# Patient Record
Sex: Male | Born: 1955 | Race: White | Hispanic: No | Marital: Married | Smoking: Never smoker
Health system: Southern US, Community
[De-identification: ages and names within clinical notes are randomized; demographics above are authoritative.]

## PROBLEM LIST (undated history)

## (undated) DIAGNOSIS — C801 Malignant (primary) neoplasm, unspecified: Secondary | ICD-10-CM

---

## 2006-12-13 DIAGNOSIS — G4730 Sleep apnea, unspecified: Secondary | ICD-10-CM

## 2006-12-13 HISTORY — DX: Sleep apnea, unspecified: G47.30

## 2017-01-19 HISTORY — PX: OTHER SURGICAL HISTORY: SHX169

## 2018-11-15 DIAGNOSIS — C61 Malignant neoplasm of prostate: Secondary | ICD-10-CM | POA: Diagnosis present

## 2019-03-18 ENCOUNTER — Encounter: Payer: Self-pay | Admitting: Emergency Medicine

## 2019-03-18 ENCOUNTER — Other Ambulatory Visit: Payer: Self-pay

## 2019-03-18 ENCOUNTER — Emergency Department: Payer: 59

## 2019-03-18 ENCOUNTER — Observation Stay
Admission: EM | Admit: 2019-03-18 | Discharge: 2019-03-19 | Disposition: A | Discharge status: Home / Self Care | Payer: 59 | Attending: Internal Medicine | Admitting: Internal Medicine

## 2019-03-18 ENCOUNTER — Observation Stay: Payer: 59

## 2019-03-18 DIAGNOSIS — G459 Transient cerebral ischemic attack, unspecified: Principal | ICD-10-CM | POA: Insufficient documentation

## 2019-03-18 DIAGNOSIS — Z20822 Contact with and (suspected) exposure to covid-19: Secondary | ICD-10-CM | POA: Diagnosis not present

## 2019-03-18 DIAGNOSIS — C61 Malignant neoplasm of prostate: Secondary | ICD-10-CM | POA: Diagnosis not present

## 2019-03-18 DIAGNOSIS — G4730 Sleep apnea, unspecified: Secondary | ICD-10-CM | POA: Diagnosis not present

## 2019-03-18 DIAGNOSIS — E785 Hyperlipidemia, unspecified: Secondary | ICD-10-CM | POA: Diagnosis not present

## 2019-03-18 DIAGNOSIS — N529 Male erectile dysfunction, unspecified: Secondary | ICD-10-CM | POA: Diagnosis not present

## 2019-03-18 DIAGNOSIS — I10 Essential (primary) hypertension: Secondary | ICD-10-CM | POA: Diagnosis present

## 2019-03-18 DIAGNOSIS — I081 Rheumatic disorders of both mitral and tricuspid valves: Secondary | ICD-10-CM | POA: Insufficient documentation

## 2019-03-18 DIAGNOSIS — R9431 Abnormal electrocardiogram [ECG] [EKG]: Secondary | ICD-10-CM | POA: Diagnosis not present

## 2019-03-18 DIAGNOSIS — R4701 Aphasia: Secondary | ICD-10-CM | POA: Insufficient documentation

## 2019-03-18 HISTORY — DX: Malignant (primary) neoplasm, unspecified: C80.1

## 2019-03-18 LAB — COMPREHENSIVE METABOLIC PANEL
ALT: 29 U/L (ref 0–44)
AST: 23 U/L (ref 15–41)
Albumin: 4.4 g/dL (ref 3.5–5.0)
Alkaline Phosphatase: 71 U/L (ref 38–126)
Anion gap: 9 (ref 5–15)
BUN: 23 mg/dL (ref 8–23)
CO2: 24 mmol/L (ref 22–32)
Calcium: 9.5 mg/dL (ref 8.9–10.3)
Chloride: 103 mmol/L (ref 98–111)
Creatinine, Ser: 1.18 mg/dL (ref 0.61–1.24)
GFR calc Af Amer: >60 mL/min (ref >60)
GFR calc non Af Amer: >60 mL/min (ref >60)
Glucose, Bld: 101 mg/dL — ABNORMAL HIGH (ref 70–99)
Potassium: 4 mmol/L (ref 3.5–5.1)
Sodium: 136 mmol/L (ref 135–145)
Total Bilirubin: 0.8 mg/dL (ref 0.3–1.2)
Total Protein: 7.3 g/dL (ref 6.5–8.1)

## 2019-03-18 LAB — DIFFERENTIAL
Abs Immature Granulocytes: 0.02 10*3/uL (ref 0.00–0.07)
Basophils Absolute: 0.1 10*3/uL (ref 0.0–0.1)
Basophils Relative: 1 %
Eosinophils Absolute: 0.4 10*3/uL (ref 0.0–0.5)
Eosinophils Relative: 5 %
Immature Granulocytes: 0 %
Lymphocytes Relative: 32 %
Lymphs Abs: 2.3 10*3/uL (ref 0.7–4.0)
Monocytes Absolute: 0.6 10*3/uL (ref 0.1–1.0)
Monocytes Relative: 8 %
Neutro Abs: 3.8 10*3/uL (ref 1.7–7.7)
Neutrophils Relative %: 54 %

## 2019-03-18 LAB — URINE DRUG SCREEN, QUALITATIVE (ARMC ONLY)
Amphetamines, Ur Screen: NOT DETECTED
Barbiturates, Ur Screen: NOT DETECTED
Benzodiazepine, Ur Scrn: NOT DETECTED
Cannabinoid 50 Ng, Ur ~~LOC~~: NOT DETECTED
Cocaine Metabolite,Ur ~~LOC~~: NOT DETECTED
MDMA (Ecstasy)Ur Screen: NOT DETECTED
Methadone Scn, Ur: NOT DETECTED
Opiate, Ur Screen: NOT DETECTED
Phencyclidine (PCP) Ur S: NOT DETECTED
Tricyclic, Ur Screen: NOT DETECTED

## 2019-03-18 LAB — CBC
HCT: 42.6 % (ref 39.0–52.0)
Hemoglobin: 14.4 g/dL (ref 13.0–17.0)
MCH: 30.5 pg (ref 26.0–34.0)
MCHC: 33.8 g/dL (ref 30.0–36.0)
MCV: 90.3 fL (ref 80.0–100.0)
Platelets: 181 10*3/uL (ref 150–400)
RBC: 4.72 MIL/uL (ref 4.22–5.81)
RDW: 12.2 % (ref 11.5–15.5)
WBC: 7.1 10*3/uL (ref 4.0–10.5)
nRBC: 0 % (ref 0.0–0.2)

## 2019-03-18 LAB — TROPONIN I (HIGH SENSITIVITY)
Troponin I (High Sensitivity): 5 ng/L (ref <18)
Troponin I (High Sensitivity): 7 ng/L (ref <18)
Troponin I (High Sensitivity): 9 ng/L (ref <18)

## 2019-03-18 LAB — URINALYSIS, ROUTINE W REFLEX MICROSCOPIC
Bilirubin Urine: NEGATIVE
Glucose, UA: NEGATIVE mg/dL
Hgb urine dipstick: NEGATIVE
Ketones, ur: NEGATIVE mg/dL
Leukocytes,Ua: NEGATIVE
Nitrite: NEGATIVE
Protein, ur: NEGATIVE mg/dL
Specific Gravity, Urine: 1.02 (ref 1.005–1.030)
pH: 6 (ref 5.0–8.0)

## 2019-03-18 LAB — GLUCOSE, CAPILLARY: Glucose-Capillary: 111 mg/dL — ABNORMAL HIGH (ref 70–99)

## 2019-03-18 LAB — PROTIME-INR
INR: 0.9 (ref 0.8–1.2)
Prothrombin Time: 12.4 seconds (ref 11.4–15.2)

## 2019-03-18 LAB — SEDIMENTATION RATE: Sed Rate: 9 mm/hr (ref 0–20)

## 2019-03-18 LAB — APTT: aPTT: 35 seconds (ref 24–36)

## 2019-03-18 LAB — FIBRIN DERIVATIVES D-DIMER (ARMC ONLY): Fibrin derivatives D-dimer (ARMC): 504.07 ng/mL (FEU) — ABNORMAL HIGH (ref 0.00–499.00)

## 2019-03-18 LAB — ETHANOL: Alcohol, Ethyl (B): <10 mg/dL (ref <10)

## 2019-03-18 MED ORDER — ASPIRIN 81 MG PO CHEW
324.0000 mg | CHEWABLE_TABLET | Freq: Once | ORAL | Status: AC
  Administered 2019-03-18: 324 mg via ORAL
  Filled 2019-03-18: qty 4

## 2019-03-18 MED ORDER — HEPARIN SODIUM (PORCINE) 5000 UNIT/ML IJ SOLN
5000.0000 [IU] | Freq: Three times a day (TID) | INTRAMUSCULAR | Status: DC
  Administered 2019-03-18 – 2019-03-19 (×2): 5000 [IU] via SUBCUTANEOUS
  Filled 2019-03-18 (×2): qty 1

## 2019-03-18 MED ORDER — ACETAMINOPHEN 160 MG/5ML PO SOLN
650.0000 mg | ORAL | Status: DC | PRN
  Filled 2019-03-18: qty 20.3

## 2019-03-18 MED ORDER — STROKE: EARLY STAGES OF RECOVERY BOOK: Freq: Once | Status: AC

## 2019-03-18 MED ORDER — ASPIRIN 325 MG PO TABS
325.0000 mg | ORAL_TABLET | Freq: Every day | ORAL | Status: DC
  Filled 2019-03-18: qty 1

## 2019-03-18 MED ORDER — ASPIRIN 300 MG RE SUPP
300.0000 mg | Freq: Every day | RECTAL | Status: DC
  Filled 2019-03-18: qty 1

## 2019-03-18 MED ORDER — IOHEXOL 350 MG/ML SOLN
75.0000 mL | Freq: Once | INTRAVENOUS | Status: AC | PRN
  Administered 2019-03-18: 75 mL via INTRAVENOUS

## 2019-03-18 MED ORDER — ASPIRIN 300 MG RE SUPP: 300.0000 mg | Freq: Every day | RECTAL | Status: DC

## 2019-03-18 MED ORDER — IOHEXOL 350 MG/ML SOLN
100.0000 mL | Freq: Once | INTRAVENOUS | Status: AC | PRN
  Administered 2019-03-18: 100 mL via INTRAVENOUS

## 2019-03-18 MED ORDER — ASPIRIN 325 MG PO TABS
325.0000 mg | ORAL_TABLET | Freq: Once | ORAL | Status: DC
  Filled 2019-03-18: qty 1

## 2019-03-18 MED ORDER — ASPIRIN 325 MG PO TABS
325.0000 mg | ORAL_TABLET | Freq: Every day | ORAL | Status: DC
  Administered 2019-03-19: 325 mg via ORAL
  Filled 2019-03-18: qty 1

## 2019-03-18 MED ORDER — ATORVASTATIN CALCIUM 20 MG PO TABS: 40.0000 mg | ORAL_TABLET | Freq: Every day | ORAL | Status: DC

## 2019-03-18 MED ORDER — ACETAMINOPHEN 325 MG PO TABS: 650.0000 mg | ORAL_TABLET | ORAL | Status: DC | PRN

## 2019-03-18 MED ORDER — ACETAMINOPHEN 650 MG RE SUPP: 650.0000 mg | RECTAL | Status: DC | PRN

## 2019-03-18 MED ORDER — SODIUM CHLORIDE 0.9 % IV SOLN: INTRAVENOUS | Status: DC

## 2019-03-18 NOTE — Assessment & Plan Note (Signed)
Pt follows with Dr. Redmond Pulling.

## 2019-03-18 NOTE — ED Notes (Signed)
PT symptoms have resolved, teleneuro states pt is not tPa candidate at this time.  Will continue to monitor closely.

## 2019-03-18 NOTE — Consult Note (Signed)
[facility] Consult Services   Date of Service:   03/18/2019 15:42:08  Impression:     .  G45.9 - Transient cerebral ischemic attack, unspecified  Comments/Sign-Out: Patient is a 64 years old man who presents to the ED after having a transient episode of expressive aphasia, His symptoms improved within 15-20 minutes. He had no other associated symptoms. NIHSS is 1 for a very subtle right facial droop. Non contrast CTH showed no acute abnormalities. Presentation is concerning for TIA. Recommend to give aspirin 325 mg po now if no contraindications and to admit for further work-up.  Metrics: Last Known Well: 03/18/2019 15:00:00 [facility] Notification Time: 03/18/2019 15:42:08 Arrival Time: 03/18/2019 15:16:00 Stamp Time: 03/18/2019 15:42:08 Time First Login Attempt: 03/18/2019 15:47:01 Symptoms: Word-finding difficulty NIHSS Start Assessment Time: 03/18/2019 16:00:28 Patient is not a candidate for Alteplase/Activase. Alteplase Medical Decision: 03/18/2019 16:05:00 Patient was not deemed candidate for Alteplase/Activase thrombolytics because of Resolved symptoms (no residual disabling symptoms).  CT head showed no acute hemorrhage or acute core infarct.  Lower Likelihood of Large Vessel Occlusion but Following Stat Studies are Recommended  CTA Head and Neck. CT Perfusion. Reviewed, No Indication of Large Vessel Occlusive Thrombus, Patient is not an NIR Candidate.   ED Physician notified of diagnostic impression and management plan on 03/18/2019 16:13:07  Our recommendations are outlined below.  Recommendations:     .  Activate Stroke Protocol Admission/Order Set     .  Stroke/Telemetry Floor     .  Neuro Checks     .  Bedside Swallow Eval     .  DVT Prophylaxis     .  IV Fluids, Normal Saline     .  Head of Bed 30 Degrees     .  Euglycemia and Avoid Hyperthermia (PRN Acetaminophen)     .  Initiate Aspirin 325 MG Daily  Routine Consultation with  [facility] for Follow up Care  Sign Out:     .  Discussed with Emergency Department Provider    ------------------------------------------------------------------------------  History of Present Illness: Patient is a 64 year old Male.  Patient was brought by EMS for symptoms of Word-finding difficulty  Patient is a 64 years old man with history of HTN who presents to the ED after having sudden onset difficulty speaking while playing with his grandkids. LKW at 15:00. By the time patient arrived to the ED symptoms have mostly resolved. He did still had a mild right facial droop.  Last seen normal was within 4.5 hours. There is no history of hemorrhagic complications or intracranial hemorrhage. There is no history of Recent Anticoagulants. There is no history of recent major surgery. There is no history of recent stroke.  Past Medical History:     . Hyperlipidemia  Anticoagulant use:  No  Antiplatelet use: No    Examination: BP(152/67), Pulse(57), Blood Glucose(111) 1A: Level of Consciousness - Alert; keenly responsive + 0 1B: Ask Month and Age - Both Questions Right + 0 1C: Blink Eyes & Squeeze Hands - Performs Both Tasks + 0 2: Test Horizontal Extraocular Movements - Normal + 0 3: Test Visual Fields - No Visual Loss + 0 4: Test Facial Palsy (Use Grimace if Obtunded) - Minor paralysis (flat nasolabial fold, smile asymmetry) + 1 5A: Test Left Arm Motor Drift - No Drift for 10 Seconds + 0 5B: Test Right Arm Motor Drift - No Drift for 10 Seconds + 0 6A: Test Left Leg Motor Drift - No Drift for 5 Seconds +  0 6B: Test Right Leg Motor Drift - No Drift for 5 Seconds + 0 7: Test Limb Ataxia (FNF/Heel-Shin) - No Ataxia + 0 8: Test Sensation - Normal; No sensory loss + 0 9: Test Language/Aphasia - Normal; No aphasia + 0 10: Test Dysarthria - Normal + 0 11: Test Extinction/Inattention - No abnormality + 0  NIHSS Score: 1  Pre-Morbid Modified Ranking Scale: 0 Points = No  symptoms at all   Patient/Family was informed the Neurology Consult would occur via TeleHealth consult by way of interactive audio and video telecommunications and consented to receiving care in this manner.   Due to the immediate potential for life-threatening deterioration due to underlying acute neurologic illness, I spent 35 minutes providing critical care. This time includes time for face to face visit via telemedicine, review of medical records, imaging studies and discussion of findings with providers, the patient and/or family.   Dr Harriet Masson   [facility] [phone]  Case [id number]

## 2019-03-18 NOTE — ED Provider Notes (Signed)
[name] Note   CSN: [id number] Arrival date & time: 03/18/19  1516  An emergency department physician performed an initial assessment on this suspected stroke patient at 1525.  History Chief Complaint  Patient presents with  . Code Stroke    Michael Aguilar is a 64 y.o. male otherwise healthy here presenting with trouble speaking.  Patient states that he went to [employer] with his grandkids at 10 AM this morning. Around 3 PM, he sat down and was try to talk to his wife and noticed that he cannot get words out.  He states that he has some slurred speech and just could not speak normally .  He stated that it lasted about 20 minutes and his speech is still a little abnormal.  Denies any weakness or numbness.  The history is provided by the patient.       No past medical history on file.  There are no problems to display for this patient.     No family history on file.  Social History   Tobacco Use  . Smoking status: Never Smoker  . Smokeless tobacco: Never Used  Substance Use Topics  . Alcohol use: Not on file  . Drug use: Not on file    Home Medications Prior to Admission medications   Not on File    Allergies    Penicillins  Review of Systems   Review of Systems  Neurological: Positive for speech difficulty.  All other systems reviewed and are negative.   Physical Exam Updated Vital Signs BP (!) 152/67   Pulse (!) 57   Temp 98.3 F (36.8 C) (Oral)   Resp 18   Ht 6\' 4"  (1.93 m)   Wt 114.3 kg   SpO2 96%   BMI 30.67 kg/m   Physical Exam Vitals and nursing note reviewed.  Constitutional:      Comments: Some expressive aphasia   HENT:     Head: Normocephalic.     Nose: Nose normal.     Mouth/Throat:     Mouth: Mucous membranes are moist.  Eyes:     Extraocular Movements: Extraocular movements intact.     Pupils: Pupils are equal, round, and reactive to light.  Cardiovascular:     Rate  and Rhythm: Normal rate and regular rhythm.     Pulses: Normal pulses.     Heart sounds: Normal heart sounds.  Pulmonary:     Effort: Pulmonary effort is normal.     Breath sounds: Normal breath sounds.  Abdominal:     General: Abdomen is flat.     Palpations: Abdomen is soft.  Musculoskeletal:        General: Normal Bazen of motion.     Cervical back: Normal Zahner of motion.  Skin:    General: Skin is warm.  Neurological:     Mental Status: He is alert.     Comments: ? L facial droop. ? Some expressive aphasia. CN otherwise unremarkable. Nl strength and sensation bilateral arms and legs. Nl finger to nose bilaterally   Psychiatric:        Mood and Affect: Mood normal.     ED Results / Procedures / Treatments   Labs (all labs ordered are listed, but only abnormal results are displayed) Labs Reviewed  GLUCOSE, CAPILLARY - Abnormal; Notable for the following components:      Result Value   Glucose-Capillary 111 (*)    All other components within normal limits  ETHANOL  PROTIME-INR  APTT  CBC  DIFFERENTIAL  COMPREHENSIVE METABOLIC PANEL  URINE DRUG SCREEN, QUALITATIVE ([place] ONLY)  URINALYSIS, ROUTINE W REFLEX MICROSCOPIC    EKG None  Radiology No results found.  Procedures Procedures (including critical care time)  Medications Ordered in ED Medications - No data to display  ED Course  I have reviewed the triage vital signs and the nursing notes.  Pertinent labs & imaging results that were available during my care of the patient were reviewed by me and considered in my medical decision making (see chart for details).    MDM Rules/Calculators/A&P                      Michael Aguilar is a 64 y.o. male who presented with expressive aphasia.  Patient had trouble speaking onset around 3 PM. His symptoms are improving but still has some expressive aphasia.  Code stroke LVO activated.  His NIH score is 1 and does not mollify for IV TPA right now.  He may be a candidate  for IR TPA.  Will get a stat CT angio head neck and CT perfusion study.  Teleneurology consulted.  6:16 PM Labs and MRI negative. Neuro recommend admission for TIA workup. His facial droop resolved. I thought he had L facial droop but teleneuro thought R facial droop but that has resolved. Expressive aphasia also resolved   Final Clinical Impression(s) / ED Diagnoses Final diagnoses:  None    Rx / DC Orders ED Discharge Orders    None       Drenda Freeze, MD 03/18/19 1819

## 2019-03-18 NOTE — Assessment & Plan Note (Signed)
We will continue on home setting so cpap .

## 2019-03-18 NOTE — ED Notes (Signed)
To CT with RN.

## 2019-03-18 NOTE — ED Triage Notes (Signed)
Pt to triage states he was playing with grand children and was trying to talk with wife and was unable to get his words out.  PT states symptoms lasted approximately 20 minutes.  PT able to answer all questions, states he still feels like not everything is coming out clearly.  Dr. Darl Householder at bedside.

## 2019-03-18 NOTE — Assessment & Plan Note (Addendum)
Pt was recently diagnosed with pcp during October of last might and was trying to manage it with diet. Pt is on noom diet.  Ordinarily he says he eats well balanced diet but today Pt did have french fries from [place] today.  Pt advised to limit sodium.

## 2019-03-18 NOTE — ED Notes (Signed)
transported to MRI

## 2019-03-18 NOTE — H&P (Signed)
History and Physical    Michael Aguilar [id number] DOB: Jun 08, 1955 DOA: 03/18/2019   PCP: Michael Arenas, MD   Patient coming from: Home.  Chief Complaint: TIA.  HPI: Michael Aguilar is a 64 y.o. male with medical history significant of dyslipidemia , second hand tobacco abuse since childhood as both parents were smokers. Patient states that he went to [employer] with his grandkids at 10 AM this morning. Around 3 PM, he sat down and was try to talk to his wife and noticed that he cannot get words out.  He states that he has some slurred speech and just could not speak normally .  He stated that it lasted about 20 minutes and his speech is still a little abnormal.  Denies any weakness or numbness. Pt also reports vision fog, where he only saw minimal and has vision loss that lasted about 10 minutes.  Pt reports aphasia not dysarthria words were incomprehensible.   ED Course:  Blood pressure 123/72, pulse 63, temperature 98.8 F (37.1 C), temperature source Oral, resp. rate 20, height 6\' 4"  (1.93 m), weight 114.3 kg, SpO2 99 %. cmp/cbc wd/wnl./ Head ct negative .  Review of Systems: As per HPI otherwise 10 point review of systems negative.   Past Medical History:  Diagnosis Date  . Sleep apnea 12/13/2006   Sleep Apnea.  On CPAP since 2005.    Past Surgical History:  Procedure Laterality Date  . left shoulder surgery Left 2019     reports that he has never smoked. He has never used smokeless tobacco. He reports current alcohol use. He reports that he does not use drugs.  Allergies  Allergen Reactions  . Penicillins Rash    Other reaction(s): Other (See Comments)    Family History  Problem Relation Age of Onset  . Bladder Cancer Father     Physical Exam: Vitals:   03/18/19 1830 03/18/19 1900 03/18/19 1949 03/18/19 2200  BP: 136/81 (!) 153/83 (!) 143/84 123/72  Pulse: 61 (!) 51 80 63  Resp: 16 17 20 20   Temp:   98 F (36.7 C) 98.8 F (37.1 C)  TempSrc:   Oral Oral   SpO2: 98% 97% 99%   Weight:      Height:         Constitutional: NAD, calm, comfortable Vitals:   03/18/19 1830 03/18/19 1900 03/18/19 1949 03/18/19 2200  BP: 136/81 (!) 153/83 (!) 143/84 123/72  Pulse: 61 (!) 51 80 63  Resp: 16 17 20 20   Temp:   98 F (36.7 C) 98.8 F (37.1 C)  TempSrc:   Oral Oral  SpO2: 98% 97% 99%   Weight:      Height:       Eyes: PERRL, lids and conjunctivae normal ENMT: Mucous membranes are moist. Posterior pharynx clear of any exudate or lesions.Normal dentition.  Neck: normal, supple, no masses, no thyromegaly Respiratory: clear to auscultation bilaterally, no wheezing, no crackles. Normal respiratory effort. No accessory muscle use.  Cardiovascular: Regular rate and rhythm, no murmurs / rubs / gallops. No extremity edema. 2+ pedal pulses. No carotid bruits.  Abdomen: no tenderness, no masses palpated. No hepatosplenomegaly. Bowel sounds positive.  Musculoskeletal: no clubbing / cyanosis. No joint deformity upper and lower extremities. Good ROM, no contractures. Normal muscle tone.  Skin: no rashes, lesions, ulcers. No induration Neurologic: CN 2-12 grossly intact.  DTR normal. Strength 5/5 in all 4.  Psychiatric: Normal judgment and insight. Alert and oriented x 3. Normal mood.  Labs on Admission: I have personally reviewed following labs and imaging studies  CBC: Recent Labs  Lab 03/18/19 1536  WBC 7.1  NEUTROABS 3.8  HGB 14.4  HCT 42.6  MCV 90.3  PLT 0000000   Basic Metabolic Panel: Recent Labs  Lab 03/18/19 1536  NA 136  K 4.0  CL 103  CO2 24  GLUCOSE 101*  BUN 23  CREATININE 1.18  CALCIUM 9.5   GFR: Estimated Creatinine Clearance: 86.3 mL/min (by C-G formula based on SCr of 1.18 mg/dL). Liver Function Tests: Recent Labs  Lab 03/18/19 1536  AST 23  ALT 29  ALKPHOS 71  BILITOT 0.8  PROT 7.3  ALBUMIN 4.4   No results for input(s): LIPASE, AMYLASE in the last 168 hours. No results for input(s): AMMONIA in the last 168  hours. Coagulation Profile: Recent Labs  Lab 03/18/19 1536  INR 0.9   CBG: Recent Labs  Lab 03/18/19 1526  GLUCAP 111*    Radiological Exams on Admission: CT Angio Head W or Wo Contrast  Result Date: 03/18/2019 CLINICAL DATA:  Slurred speech. EXAM: CT ANGIOGRAPHY HEAD AND NECK CT PERFUSION BRAIN TECHNIQUE: Multidetector CT imaging of the head and neck was performed using the standard protocol during bolus administration of intravenous contrast. Multiplanar CT image reconstructions and MIPs were obtained to evaluate the vascular anatomy. Carotid stenosis measurements (when applicable) are obtained utilizing NASCET criteria, using the distal internal carotid diameter as the denominator. Multiphase CT imaging of the brain was performed following IV bolus contrast injection. Subsequent parametric perfusion maps were calculated using RAPID software. CONTRAST:  122mL OMNIPAQUE IOHEXOL 350 MG/ML SOLN COMPARISON:  None. FINDINGS: CTA NECK FINDINGS Aortic arch: Standard 3 vessel aortic arch with widely patent arch vessel origins. Right carotid system: Patent and smooth without evidence of stenosis or dissection. Punctate focus of atherosclerotic calcification in the carotid bulb. Left carotid system: Patent and smooth without evidence of stenosis or dissection. Vertebral arteries: Patent, smooth, and codominant without evidence of stenosis or dissection with assessment of the left V1 segment mildly limited by venous contrast. Skeleton: Moderate cervical disc and facet degeneration. Fusion across the C5-6 disc space. Other neck: No evidence of cervical lymphadenopathy or mass. Upper chest: Clear lung apices. Review of the MIP images confirms the above findings CTA HEAD FINDINGS Anterior circulation: The internal carotid arteries are patent from skull base to carotid termini with atherosclerotic plaque resulting in mild proximal right supraclinoid stenosis. ACAs and MCAs are patent without evidence of proximal  branch occlusion or flow limiting A1 or M1 stenosis. Diffuse irregular appearance of the small and medium-sized vessels on thick MIPs is attributed to technical factors but does limit assessment for branch vessel stenosis. No aneurysm is identified. Posterior circulation: The intracranial vertebral arteries are widely patent to the basilar. The basilar artery is widely patent. Posterior communicating arteries are diminutive or absent. PCAs are patent without evidence of flow limiting proximal stenosis. No aneurysm is identified. Venous sinuses: Patent. Anatomic variants: None. Review of the MIP images confirms the above findings CT Brain Perfusion Findings: ASPECTS: 10 CBF (<30%) Volume: 0 mL Perfusion (Tmax>6.0s) volume: 0 mL Mismatch Volume: n/a Infarction Location: No infarct evident by CTP IMPRESSION: 1. No emergent large vessel occlusion. 2. Mild intracranial atherosclerosis with mild right ICA stenosis. 3. Widely patent cervical carotid and vertebral arteries. 4. Negative CTP. These results were called by telephone at the time of interpretation on 03/18/2019 at 3:58 pm to Dr. Shirlyn Goltz , who verbally acknowledged these results.  Electronically Signed   By: Logan Bores M.D.   On: 03/18/2019 16:24   CT Angio Neck W and/or Wo Contrast  Result Date: 03/18/2019 CLINICAL DATA:  Slurred speech. EXAM: CT ANGIOGRAPHY HEAD AND NECK CT PERFUSION BRAIN TECHNIQUE: Multidetector CT imaging of the head and neck was performed using the standard protocol during bolus administration of intravenous contrast. Multiplanar CT image reconstructions and MIPs were obtained to evaluate the vascular anatomy. Carotid stenosis measurements (when applicable) are obtained utilizing NASCET criteria, using the distal internal carotid diameter as the denominator. Multiphase CT imaging of the brain was performed following IV bolus contrast injection. Subsequent parametric perfusion maps were calculated using RAPID software. CONTRAST:  116mL  OMNIPAQUE IOHEXOL 350 MG/ML SOLN COMPARISON:  None. FINDINGS: CTA NECK FINDINGS Aortic arch: Standard 3 vessel aortic arch with widely patent arch vessel origins. Right carotid system: Patent and smooth without evidence of stenosis or dissection. Punctate focus of atherosclerotic calcification in the carotid bulb. Left carotid system: Patent and smooth without evidence of stenosis or dissection. Vertebral arteries: Patent, smooth, and codominant without evidence of stenosis or dissection with assessment of the left V1 segment mildly limited by venous contrast. Skeleton: Moderate cervical disc and facet degeneration. Fusion across the C5-6 disc space. Other neck: No evidence of cervical lymphadenopathy or mass. Upper chest: Clear lung apices. Review of the MIP images confirms the above findings CTA HEAD FINDINGS Anterior circulation: The internal carotid arteries are patent from skull base to carotid termini with atherosclerotic plaque resulting in mild proximal right supraclinoid stenosis. ACAs and MCAs are patent without evidence of proximal branch occlusion or flow limiting A1 or M1 stenosis. Diffuse irregular appearance of the small and medium-sized vessels on thick MIPs is attributed to technical factors but does limit assessment for branch vessel stenosis. No aneurysm is identified. Posterior circulation: The intracranial vertebral arteries are widely patent to the basilar. The basilar artery is widely patent. Posterior communicating arteries are diminutive or absent. PCAs are patent without evidence of flow limiting proximal stenosis. No aneurysm is identified. Venous sinuses: Patent. Anatomic variants: None. Review of the MIP images confirms the above findings CT Brain Perfusion Findings: ASPECTS: 10 CBF (<30%) Volume: 0 mL Perfusion (Tmax>6.0s) volume: 0 mL Mismatch Volume: n/a Infarction Location: No infarct evident by CTP IMPRESSION: 1. No emergent large vessel occlusion. 2. Mild intracranial  atherosclerosis with mild right ICA stenosis. 3. Widely patent cervical carotid and vertebral arteries. 4. Negative CTP. These results were called by telephone at the time of interpretation on 03/18/2019 at 3:58 pm to Dr. Shirlyn Goltz , who verbally acknowledged these results. Electronically Signed   By: Logan Bores M.D.   On: 03/18/2019 16:24   MR BRAIN WO CONTRAST  Result Date: 03/18/2019 CLINICAL DATA:  TIA. Slurred speech and facial droop. EXAM: MRI HEAD WITHOUT CONTRAST TECHNIQUE: Multiplanar, multiecho pulse sequences of the brain and surrounding structures were obtained without intravenous contrast. COMPARISON:  Head CT, CTA, and CTP 03/18/2019 FINDINGS: Brain: There is no evidence of acute infarct, intracranial hemorrhage, mass, midline shift, or extra-axial fluid collection. The ventricles and sulci are normal. A few small foci of T2 hyperintensity in the cerebral white matter are within normal limits for age. Vascular: Major intracranial vascular flow voids are preserved. Skull and upper cervical spine: Unremarkable bone marrow signal. Sinuses/Orbits: Unremarkable orbits. Mucosal thickening throughout the paranasal sinuses, moderate in the ethmoid sinuses. Right maxillary sinus mucous retention cyst. Clear mastoid air cells. Other: None. IMPRESSION: Unremarkable appearance of the brain for  age. Electronically Signed   By: Logan Bores M.D.   On: 03/18/2019 17:37   CT CEREBRAL PERFUSION W CONTRAST  Result Date: 03/18/2019 CLINICAL DATA:  Slurred speech. EXAM: CT ANGIOGRAPHY HEAD AND NECK CT PERFUSION BRAIN TECHNIQUE: Multidetector CT imaging of the head and neck was performed using the standard protocol during bolus administration of intravenous contrast. Multiplanar CT image reconstructions and MIPs were obtained to evaluate the vascular anatomy. Carotid stenosis measurements (when applicable) are obtained utilizing NASCET criteria, using the distal internal carotid diameter as the denominator.  Multiphase CT imaging of the brain was performed following IV bolus contrast injection. Subsequent parametric perfusion maps were calculated using RAPID software. CONTRAST:  174mL OMNIPAQUE IOHEXOL 350 MG/ML SOLN COMPARISON:  None. FINDINGS: CTA NECK FINDINGS Aortic arch: Standard 3 vessel aortic arch with widely patent arch vessel origins. Right carotid system: Patent and smooth without evidence of stenosis or dissection. Punctate focus of atherosclerotic calcification in the carotid bulb. Left carotid system: Patent and smooth without evidence of stenosis or dissection. Vertebral arteries: Patent, smooth, and codominant without evidence of stenosis or dissection with assessment of the left V1 segment mildly limited by venous contrast. Skeleton: Moderate cervical disc and facet degeneration. Fusion across the C5-6 disc space. Other neck: No evidence of cervical lymphadenopathy or mass. Upper chest: Clear lung apices. Review of the MIP images confirms the above findings CTA HEAD FINDINGS Anterior circulation: The internal carotid arteries are patent from skull base to carotid termini with atherosclerotic plaque resulting in mild proximal right supraclinoid stenosis. ACAs and MCAs are patent without evidence of proximal branch occlusion or flow limiting A1 or M1 stenosis. Diffuse irregular appearance of the small and medium-sized vessels on thick MIPs is attributed to technical factors but does limit assessment for branch vessel stenosis. No aneurysm is identified. Posterior circulation: The intracranial vertebral arteries are widely patent to the basilar. The basilar artery is widely patent. Posterior communicating arteries are diminutive or absent. PCAs are patent without evidence of flow limiting proximal stenosis. No aneurysm is identified. Venous sinuses: Patent. Anatomic variants: None. Review of the MIP images confirms the above findings CT Brain Perfusion Findings: ASPECTS: 10 CBF (<30%) Volume: 0 mL  Perfusion (Tmax>6.0s) volume: 0 mL Mismatch Volume: n/a Infarction Location: No infarct evident by CTP IMPRESSION: 1. No emergent large vessel occlusion. 2. Mild intracranial atherosclerosis with mild right ICA stenosis. 3. Widely patent cervical carotid and vertebral arteries. 4. Negative CTP. These results were called by telephone at the time of interpretation on 03/18/2019 at 3:58 pm to Dr. Shirlyn Goltz , who verbally acknowledged these results. Electronically Signed   By: Logan Bores M.D.   On: 03/18/2019 16:24   CT HEAD CODE STROKE WO CONTRAST  Result Date: 03/18/2019 CLINICAL DATA:  Code stroke.  Slurred speech. EXAM: CT HEAD WITHOUT CONTRAST TECHNIQUE: Contiguous axial images were obtained from the base of the skull through the vertex without intravenous contrast. COMPARISON:  None. FINDINGS: Brain: There is no evidence of acute infarct, intracranial hemorrhage, mass, midline shift, or extra-axial fluid collection. The ventricles and sulci are within normal limits for age. Vascular: Calcified atherosclerosis at the skull base. No hyperdense vessel. Skull: No fracture or suspicious osseous lesion. Sinuses/Orbits: Moderate bilateral ethmoid air cell and mild bilateral sphenoid sinus mucosal thickening. Partially visualized right maxillary sinus osteitis indicative of chronic inflammation. Clear mastoid air cells. Unremarkable orbits. Other: None. ASPECTS [facility] Stroke Program Early CT Score) - Ganglionic level infarction (caudate, lentiform nuclei, internal capsule, insula, M1-M3 cortex):  7 - Supraganglionic infarction (M4-M6 cortex): 3 Total score (0-10 with 10 being normal): 10 IMPRESSION: 1. No evidence of acute intracranial abnormality. 2. ASPECTS is 10. These results were called by telephone at the time of interpretation on 03/18/2019 at 3:58 pm to Dr. Shirlyn Goltz , who verbally acknowledged these results. Electronically Signed   By: Logan Bores M.D.   On: 03/18/2019 15:59    EKG: Independently  reviewed. Sinus bradycardia 52 with ? Delta waves.   Assessment/Plan Principal Problem:   Aphasia Active Problems:   HTN (hypertension)   Sleep apnea   Prostate cancer [facility])   Erectile dysfunction  Problem Assessment/Plan  Aphasia Assessment & Plan Pt has transient episode of aphasia and vision loss.  D/D include TIA, Dysrhythmia related maybe hypotension to produce symptoms of Cerebrovascular insufficiency.  MRI brain is negative.  We will admit pt to monitor heart rate and rhythm. Repeat EKG ? Delta waves there is slurring at Q waves.  Pt is also advised to get an eye exam.  We have started pt on aspirin and statin therapy. He does not take asa .  HTN (hypertension) Assessment & Plan Pt was recently diagnosed with pcp during October of last might and was trying to manage it with diet. Pt is on noom diet.  Ordinarily he says he eats well balanced diet but today Pt did have french fries from [place] today.  Pt advised to limit sodium. Start pt on low dose lisinopril for BP control. NO meds O/N to allow for permissive htn.   Sleep apnea Assessment & Plan We will continue on home setting so cpap .  Prostate cancer [facility] Assessment & Plan Pt follows with Dr. Redmond Pulling.   Abnormal ekg 2 d echo with bubble study.  Consider cardiology.   DVT prophylaxis: Heparin Code Status: full code Family Communication: none Disposition Plan: home Consults called: Teleneurology Admission status: Observation   Para Skeans MD [facility] If 7PM-7AM, please contact night-coverage [email or web address] Password [facility]  03/18/2019, 10:11 PM

## 2019-03-18 NOTE — ED Notes (Signed)
Dr Darl Householder at bedside.

## 2019-03-18 NOTE — Assessment & Plan Note (Addendum)
Pt has transient episode of aphasia and vision loss.  D/D include TIA, Dysrhythmia related maybe hypotension to produce symptoms of Cerebrovascular insufficiency.  MRI brain is negative.  We will admit pt to monitor heart rate and rhythm. Repeat EKG ? Delta waves there is slurring at Q waves.  Pt is also advised to get an eye exam.  We have started pt on aspirin and statin therapy. He does not take asa .

## 2019-03-18 NOTE — ED Notes (Signed)
Pt uses cpap at night.  Wife aware of admission.

## 2019-03-18 NOTE — Progress Notes (Signed)
CPAP postponed until Covid results come back. (not in a negative pressure room)

## 2019-03-19 ENCOUNTER — Observation Stay (HOSPITAL_BASED_OUTPATIENT_CLINIC_OR_DEPARTMENT_OTHER)
Admit: 2019-03-19 | Discharge: 2019-03-19 | Disposition: A | Discharge status: Home / Self Care | Payer: 59 | Attending: Internal Medicine | Admitting: Internal Medicine

## 2019-03-19 DIAGNOSIS — G459 Transient cerebral ischemic attack, unspecified: Secondary | ICD-10-CM

## 2019-03-19 DIAGNOSIS — G4730 Sleep apnea, unspecified: Secondary | ICD-10-CM | POA: Diagnosis not present

## 2019-03-19 DIAGNOSIS — I361 Nonrheumatic tricuspid (valve) insufficiency: Secondary | ICD-10-CM

## 2019-03-19 DIAGNOSIS — R4701 Aphasia: Secondary | ICD-10-CM | POA: Diagnosis not present

## 2019-03-19 DIAGNOSIS — I340 Nonrheumatic mitral (valve) insufficiency: Secondary | ICD-10-CM | POA: Diagnosis not present

## 2019-03-19 DIAGNOSIS — I10 Essential (primary) hypertension: Secondary | ICD-10-CM | POA: Diagnosis not present

## 2019-03-19 LAB — HEMOGLOBIN A1C
Hgb A1c MFr Bld: 5.2 % (ref 4.8–5.6)
Mean Plasma Glucose: 102.54 mg/dL

## 2019-03-19 LAB — LIPID PANEL
Cholesterol: 141 mg/dL (ref 0–200)
HDL: 33 mg/dL — ABNORMAL LOW (ref >40)
LDL Cholesterol: 88 mg/dL (ref 0–99)
Total CHOL/HDL Ratio: 4.3 RATIO
Triglycerides: 98 mg/dL (ref <150)
VLDL: 20 mg/dL (ref 0–40)

## 2019-03-19 LAB — ECHOCARDIOGRAM COMPLETE
Height: 76 in
Weight: 4032 oz

## 2019-03-19 LAB — SARS CORONAVIRUS 2 (TAT 6-24 HRS): SARS Coronavirus 2: NEGATIVE

## 2019-03-19 LAB — HIV ANTIBODY (ROUTINE TESTING W REFLEX): HIV Screen 4th Generation wRfx: NONREACTIVE

## 2019-03-19 MED ORDER — ATORVASTATIN CALCIUM 20 MG PO TABS: 40.0000 mg | ORAL_TABLET | Freq: Every day | ORAL | Status: DC

## 2019-03-19 MED ORDER — ATORVASTATIN CALCIUM 20 MG PO TABS: 20.0000 mg | ORAL_TABLET | Freq: Every day | ORAL | Status: DC

## 2019-03-19 MED ORDER — AMLODIPINE BESYLATE 5 MG PO TABS
5.0000 mg | ORAL_TABLET | Freq: Every day | ORAL | Status: DC
  Administered 2019-03-19: 5 mg via ORAL
  Filled 2019-03-19: qty 1

## 2019-03-19 MED ORDER — AMLODIPINE BESYLATE 5 MG PO TABS: 5.0000 mg | ORAL_TABLET | Freq: Every day | ORAL | 1 refills | Status: AC

## 2019-03-19 MED ORDER — ASPIRIN 81 MG PO CHEW
324.0000 mg | CHEWABLE_TABLET | Freq: Every day | ORAL | Status: DC
  Filled 2019-03-19: qty 4

## 2019-03-19 MED ORDER — ASPIRIN 81 MG PO CHEW: 324.0000 mg | CHEWABLE_TABLET | Freq: Every day | ORAL | 0 refills | Status: AC

## 2019-03-19 MED ORDER — ATORVASTATIN CALCIUM 20 MG PO TABS: 40.0000 mg | ORAL_TABLET | Freq: Every day | ORAL | 1 refills | Status: AC

## 2019-03-19 MED ORDER — ASPIRIN 81 MG PO CHEW: 81.0000 mg | CHEWABLE_TABLET | Freq: Every day | ORAL | Status: DC

## 2019-03-19 NOTE — Discharge Summary (Signed)
Michael Aguilar at [facility] NAME: Michael Aguilar    MR#:  [id number]  DATE OF BIRTH:  01-09-1956  DATE OF ADMISSION:  03/18/2019 ADMITTING PHYSICIAN: Para Skeans, MD  DATE OF DISCHARGE: 03/19/2019  PRIMARY CARE PHYSICIAN: Worf, Leslye Peer, MD    ADMISSION DIAGNOSIS:  Aphasia [R47.01] TIA (transient ischemic attack) [G45.9]  DISCHARGE DIAGNOSIS:  TIA  SECONDARY DIAGNOSIS:   Past Medical History:  Diagnosis Date  . Cancer ([place])   . Sleep apnea 12/13/2006   Sleep Apnea.  On CPAP since 2005.    HOSPITAL COURSE:   Michael Aguilar is a 64 y.o. male with medical history significant of dyslipidemia.  Patient states that he went to [employer] with his grandkids at 10 AM this morning. Around 3 PM, he sat down and was try to talk to his wife and noticed that he cannot get words out. He states that he has some slurred speech and just could not speak normally.  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  -no neuro defictis -Echo today  HTN (hypertension) Pt was recently diagnosed with pcp during October of last might and was trying to manage it with diet. Pt is on noom diet.   -start low dose norvasc 5 mg qd at discharge and keep log of BP at home -Pt advised to limit sodium.  Sleep apnea -continue on home setting so cpap .  Prostate cancer -Pt follows with Dr. Redmond Pulling.   pt feels back to baseline. Ok to go home after echo. D/w pt and wife and Neurologist. Pt advised to get 30 day event monitor be set up by PCP as out pt. CONSULTS OBTAINED:    DRUG ALLERGIES:   Allergies  Allergen Reactions  . Penicillins Rash    Other reaction(s): Other (See Comments)    DISCHARGE MEDICATIONS:   Allergies as of 03/19/2019      Reactions    Penicillins Rash   Other reaction(s): Other (See Comments)      Medication List    TAKE these medications   amLODipine 5 MG tablet Commonly known as: NORVASC Take 1 tablet (5 mg total) by mouth daily.   aspirin 81 MG chewable tablet Chew 4 tablets (324 mg total) by mouth daily. Start taking on: March 20, 2019   atorvastatin 20 MG tablet Commonly known as: LIPITOR Take 2 tablets (40 mg total) by mouth daily. What changed: how much to take       If you experience worsening of your admission symptoms, develop shortness of breath, life threatening emergency, suicidal or homicidal thoughts you must seek medical attention immediately by calling 911 or calling your MD immediately  if symptoms less severe.  You Must read complete instructions/literature along with all the possible adverse reactions/side effects for all the Medicines you take and that have been prescribed to you. Take any new Medicines after you have completely understood and accept all the possible adverse reactions/side effects.   Please note  You were cared for by a hospitalist during your hospital stay. If you have any questions about your discharge medications or the care you received while you were in the hospital after you are discharged, you can call the unit and asked to speak with the hospitalist on  call if the hospitalist that took care of you is not available. Once you are discharged, your primary care physician will handle any further medical issues. Please note that NO REFILLS for any discharge medications will be authorized once you are discharged, as it is imperative that you return to your primary care physician (or establish a relationship with a primary care physician if you do not have one) for your aftercare needs so that they can reassess your need for medications and monitor your lab values. Today   SUBJECTIVE   No new complaints  VITAL SIGNS:  Blood pressure 121/75, pulse (!) 56, temperature (!)  97.5 F (36.4 C), temperature source Oral, resp. rate 18, height 6\' 4"  (1.93 m), weight 114.3 kg, SpO2 97 %.  I/O:    Intake/Output Summary (Last 24 hours) at 03/19/2019 1016 Last data filed at 03/19/2019 0951 Gross per 24 hour  Intake 532.21 ml  Output --  Net 532.21 ml    PHYSICAL EXAMINATION:  GENERAL:  64 y.o.-year-old patient lying in the bed with no acute distress.  EYES: Pupils equal, round, reactive to light and accommodation. No scleral icterus.  HEENT: Head atraumatic, normocephalic. Oropharynx and nasopharynx clear.  NECK:  Supple, no jugular venous distention. No thyroid enlargement, no tenderness.  LUNGS: Normal breath sounds bilaterally, no wheezing, rales,rhonchi or crepitation. No use of accessory muscles of respiration.  CARDIOVASCULAR: S1, S2 normal. No murmurs, rubs, or gallops.  ABDOMEN: Soft, non-tender, non-distended. Bowel sounds present. No organomegaly or mass.  EXTREMITIES: No pedal edema, cyanosis, or clubbing.  NEUROLOGIC: Cranial nerves II through XII are intact. Muscle strength 5/5 in all extremities. Sensation intact. Gait not checked.  PSYCHIATRIC: The patient is alert and oriented x 3.  SKIN: No obvious rash, lesion, or ulcer.   DATA REVIEW:   CBC  Recent Labs  Lab 03/18/19 1536  WBC 7.1  HGB 14.4  HCT 42.6  PLT 181    Chemistries  Recent Labs  Lab 03/18/19 1536  NA 136  K 4.0  CL 103  CO2 24  GLUCOSE 101*  BUN 23  CREATININE 1.18  CALCIUM 9.5  AST 23  ALT 29  ALKPHOS 71  BILITOT 0.8    Microbiology Results   Recent Results (from the past 240 hour(s))  SARS CORONAVIRUS 2 (TAT 6-24 HRS) Nasopharyngeal Nasopharyngeal Swab     Status: None   Collection Time: 03/18/19  4:19 PM   Specimen: Nasopharyngeal Swab  Result Value Ref Sobh Status   SARS Coronavirus 2 NEGATIVE NEGATIVE Final    Comment: (NOTE) SARS-CoV-2 target nucleic acids are NOT DETECTED. The SARS-CoV-2 RNA is generally detectable in upper and lower respiratory  specimens during the acute phase of infection. Negative results do not preclude SARS-CoV-2 infection, do not rule out co-infections with other pathogens, and should not be used as the sole basis for treatment or other patient management decisions. Negative results must be combined with clinical observations, patient history, and epidemiological information. The expected result is Negative. Fact Sheet for Patients: SugarRoll.be Fact Sheet for Healthcare Providers: https://www.woods-mathews.com/ This test is not yet approved or cleared by the [place] FDA and  has been authorized for detection and/or diagnosis of SARS-CoV-2 by FDA under an Emergency Use Authorization (EUA). This EUA will remain  in effect (meaning this test can be used) for the duration of the COVID-19 declaration under Section 56 4(b)(1) of the Act, 21 U.S.C. section 360bbb-3(b)(1), unless the authorization is terminated or revoked sooner. Performed at [facility]  [facility], Clyde Aguilar [address], [place], [place] [address]     RADIOLOGY:  CT Angio Head W or Wo Contrast  Result Date: 03/18/2019 CLINICAL DATA:  Slurred speech. EXAM: CT ANGIOGRAPHY HEAD AND NECK CT PERFUSION BRAIN TECHNIQUE: Multidetector CT imaging of the head and neck was performed using the standard protocol during bolus administration of intravenous contrast. Multiplanar CT image reconstructions and MIPs were obtained to evaluate the vascular anatomy. Carotid stenosis measurements (when applicable) are obtained utilizing NASCET criteria, using the distal internal carotid diameter as the denominator. Multiphase CT imaging of the brain was performed following IV bolus contrast injection. Subsequent parametric perfusion maps were calculated using RAPID software. CONTRAST:  193mL OMNIPAQUE IOHEXOL 350 MG/ML SOLN COMPARISON:  None. FINDINGS: CTA NECK FINDINGS Aortic arch: Standard 3 vessel aortic arch with widely patent arch  vessel origins. Right carotid system: Patent and smooth without evidence of stenosis or dissection. Punctate focus of atherosclerotic calcification in the carotid bulb. Left carotid system: Patent and smooth without evidence of stenosis or dissection. Vertebral arteries: Patent, smooth, and codominant without evidence of stenosis or dissection with assessment of the left V1 segment mildly limited by venous contrast. Skeleton: Moderate cervical disc and facet degeneration. Fusion across the C5-6 disc space. Other neck: No evidence of cervical lymphadenopathy or mass. Upper chest: Clear lung apices. Review of the MIP images confirms the above findings CTA HEAD FINDINGS Anterior circulation: The internal carotid arteries are patent from skull base to carotid termini with atherosclerotic plaque resulting in mild proximal right supraclinoid stenosis. ACAs and MCAs are patent without evidence of proximal branch occlusion or flow limiting A1 or M1 stenosis. Diffuse irregular appearance of the small and medium-sized vessels on thick MIPs is attributed to technical factors but does limit assessment for branch vessel stenosis. No aneurysm is identified. Posterior circulation: The intracranial vertebral arteries are widely patent to the basilar. The basilar artery is widely patent. Posterior communicating arteries are diminutive or absent. PCAs are patent without evidence of flow limiting proximal stenosis. No aneurysm is identified. Venous sinuses: Patent. Anatomic variants: None. Review of the MIP images confirms the above findings CT Brain Perfusion Findings: ASPECTS: 10 CBF (<30%) Volume: 0 mL Perfusion (Tmax>6.0s) volume: 0 mL Mismatch Volume: n/a Infarction Location: No infarct evident by CTP IMPRESSION: 1. No emergent large vessel occlusion. 2. Mild intracranial atherosclerosis with mild right ICA stenosis. 3. Widely patent cervical carotid and vertebral arteries. 4. Negative CTP. These results were called by telephone  at the time of interpretation on 03/18/2019 at 3:58 pm to Dr. Shirlyn Goltz , who verbally acknowledged these results. Electronically Signed   By: Logan Bores M.D.   On: 03/18/2019 16:24   CT Angio Neck W and/or Wo Contrast  Result Date: 03/18/2019 CLINICAL DATA:  Slurred speech. EXAM: CT ANGIOGRAPHY HEAD AND NECK CT PERFUSION BRAIN TECHNIQUE: Multidetector CT imaging of the head and neck was performed using the standard protocol during bolus administration of intravenous contrast. Multiplanar CT image reconstructions and MIPs were obtained to evaluate the vascular anatomy. Carotid stenosis measurements (when applicable) are obtained utilizing NASCET criteria, using the distal internal carotid diameter as the denominator. Multiphase CT imaging of the brain was performed following IV bolus contrast injection. Subsequent parametric perfusion maps were calculated using RAPID software. CONTRAST:  128mL OMNIPAQUE IOHEXOL 350 MG/ML SOLN COMPARISON:  None. FINDINGS: CTA NECK FINDINGS Aortic arch: Standard 3 vessel aortic arch with widely patent arch vessel origins. Right carotid system: Patent and smooth without evidence of stenosis or  dissection. Punctate focus of atherosclerotic calcification in the carotid bulb. Left carotid system: Patent and smooth without evidence of stenosis or dissection. Vertebral arteries: Patent, smooth, and codominant without evidence of stenosis or dissection with assessment of the left V1 segment mildly limited by venous contrast. Skeleton: Moderate cervical disc and facet degeneration. Fusion across the C5-6 disc space. Other neck: No evidence of cervical lymphadenopathy or mass. Upper chest: Clear lung apices. Review of the MIP images confirms the above findings CTA HEAD FINDINGS Anterior circulation: The internal carotid arteries are patent from skull base to carotid termini with atherosclerotic plaque resulting in mild proximal right supraclinoid stenosis. ACAs and MCAs are patent without  evidence of proximal branch occlusion or flow limiting A1 or M1 stenosis. Diffuse irregular appearance of the small and medium-sized vessels on thick MIPs is attributed to technical factors but does limit assessment for branch vessel stenosis. No aneurysm is identified. Posterior circulation: The intracranial vertebral arteries are widely patent to the basilar. The basilar artery is widely patent. Posterior communicating arteries are diminutive or absent. PCAs are patent without evidence of flow limiting proximal stenosis. No aneurysm is identified. Venous sinuses: Patent. Anatomic variants: None. Review of the MIP images confirms the above findings CT Brain Perfusion Findings: ASPECTS: 10 CBF (<30%) Volume: 0 mL Perfusion (Tmax>6.0s) volume: 0 mL Mismatch Volume: n/a Infarction Location: No infarct evident by CTP IMPRESSION: 1. No emergent large vessel occlusion. 2. Mild intracranial atherosclerosis with mild right ICA stenosis. 3. Widely patent cervical carotid and vertebral arteries. 4. Negative CTP. These results were called by telephone at the time of interpretation on 03/18/2019 at 3:58 pm to Dr. Shirlyn Goltz , who verbally acknowledged these results. Electronically Signed   By: Logan Bores M.D.   On: 03/18/2019 16:24   CT ANGIO CHEST PE W OR WO CONTRAST  Result Date: 03/18/2019 CLINICAL DATA:  Shortness of breath, positive D-dimer EXAM: CT ANGIOGRAPHY CHEST WITH CONTRAST TECHNIQUE: Multidetector CT imaging of the chest was performed using the standard protocol during bolus administration of intravenous contrast. Multiplanar CT image reconstructions and MIPs were obtained to evaluate the vascular anatomy. CONTRAST:  39mL OMNIPAQUE IOHEXOL 350 MG/ML SOLN COMPARISON:  None. FINDINGS: Cardiovascular: This is a technically adequate evaluation of the pulmonary vasculature. No filling defects or pulmonary emboli. No pericardial effusion.  The thoracic aorta is normal in caliber. Mediastinum/Nodes: No enlarged  mediastinal, hilar, or axillary lymph nodes. Thyroid gland, trachea, and esophagus demonstrate no significant findings. Lungs/Pleura: No airspace disease, effusion, or pneumothorax. Central airways are patent. Upper Abdomen: No acute abnormality. Musculoskeletal: No acute or destructive bony lesions. Reconstructed images demonstrate no additional findings. Review of the MIP images confirms the above findings. IMPRESSION: 1. No evidence of pulmonary embolus. 2. No acute intrathoracic process. Electronically Signed   By: Randa Ngo M.D.   On: 03/18/2019 23:36   MR BRAIN WO CONTRAST  Result Date: 03/18/2019 CLINICAL DATA:  TIA. Slurred speech and facial droop. EXAM: MRI HEAD WITHOUT CONTRAST TECHNIQUE: Multiplanar, multiecho pulse sequences of the brain and surrounding structures were obtained without intravenous contrast. COMPARISON:  Head CT, CTA, and CTP 03/18/2019 FINDINGS: Brain: There is no evidence of acute infarct, intracranial hemorrhage, mass, midline shift, or extra-axial fluid collection. The ventricles and sulci are normal. A few small foci of T2 hyperintensity in the cerebral white matter are within normal limits for age. Vascular: Major intracranial vascular flow voids are preserved. Skull and upper cervical spine: Unremarkable bone marrow signal. Sinuses/Orbits: Unremarkable orbits. Mucosal thickening throughout  the paranasal sinuses, moderate in the ethmoid sinuses. Right maxillary sinus mucous retention cyst. Clear mastoid air cells. Other: None. IMPRESSION: Unremarkable appearance of the brain for age. Electronically Signed   By: Logan Bores M.D.   On: 03/18/2019 17:37   CT CEREBRAL PERFUSION W CONTRAST  Result Date: 03/18/2019 CLINICAL DATA:  Slurred speech. EXAM: CT ANGIOGRAPHY HEAD AND NECK CT PERFUSION BRAIN TECHNIQUE: Multidetector CT imaging of the head and neck was performed using the standard protocol during bolus administration of intravenous contrast. Multiplanar CT image  reconstructions and MIPs were obtained to evaluate the vascular anatomy. Carotid stenosis measurements (when applicable) are obtained utilizing NASCET criteria, using the distal internal carotid diameter as the denominator. Multiphase CT imaging of the brain was performed following IV bolus contrast injection. Subsequent parametric perfusion maps were calculated using RAPID software. CONTRAST:  141mL OMNIPAQUE IOHEXOL 350 MG/ML SOLN COMPARISON:  None. FINDINGS: CTA NECK FINDINGS Aortic arch: Standard 3 vessel aortic arch with widely patent arch vessel origins. Right carotid system: Patent and smooth without evidence of stenosis or dissection. Punctate focus of atherosclerotic calcification in the carotid bulb. Left carotid system: Patent and smooth without evidence of stenosis or dissection. Vertebral arteries: Patent, smooth, and codominant without evidence of stenosis or dissection with assessment of the left V1 segment mildly limited by venous contrast. Skeleton: Moderate cervical disc and facet degeneration. Fusion across the C5-6 disc space. Other neck: No evidence of cervical lymphadenopathy or mass. Upper chest: Clear lung apices. Review of the MIP images confirms the above findings CTA HEAD FINDINGS Anterior circulation: The internal carotid arteries are patent from skull base to carotid termini with atherosclerotic plaque resulting in mild proximal right supraclinoid stenosis. ACAs and MCAs are patent without evidence of proximal branch occlusion or flow limiting A1 or M1 stenosis. Diffuse irregular appearance of the small and medium-sized vessels on thick MIPs is attributed to technical factors but does limit assessment for branch vessel stenosis. No aneurysm is identified. Posterior circulation: The intracranial vertebral arteries are widely patent to the basilar. The basilar artery is widely patent. Posterior communicating arteries are diminutive or absent. PCAs are patent without evidence of flow  limiting proximal stenosis. No aneurysm is identified. Venous sinuses: Patent. Anatomic variants: None. Review of the MIP images confirms the above findings CT Brain Perfusion Findings: ASPECTS: 10 CBF (<30%) Volume: 0 mL Perfusion (Tmax>6.0s) volume: 0 mL Mismatch Volume: n/a Infarction Location: No infarct evident by CTP IMPRESSION: 1. No emergent large vessel occlusion. 2. Mild intracranial atherosclerosis with mild right ICA stenosis. 3. Widely patent cervical carotid and vertebral arteries. 4. Negative CTP. These results were called by telephone at the time of interpretation on 03/18/2019 at 3:58 pm to Dr. Shirlyn Goltz , who verbally acknowledged these results. Electronically Signed   By: Logan Bores M.D.   On: 03/18/2019 16:24   CT HEAD CODE STROKE WO CONTRAST  Result Date: 03/18/2019 CLINICAL DATA:  Code stroke.  Slurred speech. EXAM: CT HEAD WITHOUT CONTRAST TECHNIQUE: Contiguous axial images were obtained from the base of the skull through the vertex without intravenous contrast. COMPARISON:  None. FINDINGS: Brain: There is no evidence of acute infarct, intracranial hemorrhage, mass, midline shift, or extra-axial fluid collection. The ventricles and sulci are within normal limits for age. Vascular: Calcified atherosclerosis at the skull base. No hyperdense vessel. Skull: No fracture or suspicious osseous lesion. Sinuses/Orbits: Moderate bilateral ethmoid air cell and mild bilateral sphenoid sinus mucosal thickening. Partially visualized right maxillary sinus osteitis indicative of chronic inflammation.  Clear mastoid air cells. Unremarkable orbits. Other: None. ASPECTS [facility] Stroke Program Early CT Score) - Ganglionic level infarction (caudate, lentiform nuclei, internal capsule, insula, M1-M3 cortex): 7 - Supraganglionic infarction (M4-M6 cortex): 3 Total score (0-10 with 10 being normal): 10 IMPRESSION: 1. No evidence of acute intracranial abnormality. 2. ASPECTS is 10. These results were called by  telephone at the time of interpretation on 03/18/2019 at 3:58 pm to Dr. Shirlyn Goltz , who verbally acknowledged these results. Electronically Signed   By: Logan Bores M.D.   On: 03/18/2019 15:59     CODE STATUS:     Code Status Orders  (From admission, onward)         Start     Ordered   03/18/19 1853  Full code  Continuous     03/18/19 1854        Code Status History    This patient has a current code status but no historical code status.   Advance Care Planning Activity       TOTAL TIME TAKING CARE OF THIS PATIENT: 35 minutes.    Fritzi Mandes M.D  [facility]    CC: Primary care physician; Worf, Leslye Peer, MD

## 2019-03-19 NOTE — Evaluation (Signed)
Physical Therapy Evaluation and Discharge  Patient Details Name: Michael Aguilar MRN: [id number] DOB: January 16, 1956 Today's Date: 03/19/2019   History of Present Illness  pt is a 64 yo male admitted for stroke workup after experiencing aphasia and visual changes, MRI negative, CT head negative. PMH includes HTN, sleep apnea on CPAP  Clinical Impression  Pt is a 64 yo male admitted for above. 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 Pt has started a new diet in an effort to lose weight and decrease blood pressure. Pt presents with 5/5 strength in B UE/LEs and independent with functional mobility. Pt ambulated two laps around nurses station without AD with no LOB or unsteadiness. Pt steady with changes in gait speed, vertical and horizontal head turns and stepping over obstacles. Pt safe on 4 steps without AD. Pt reports no neuro symptoms and that all speech difficulty and visual changes resolved yesterday. Pt reports feeling tired but otherwise at baseline. Pt educated on increasing physical activity throughout the day in order to assist with weight loss and improving stroke risk factors. Pt educated on current stroke risk factors including his level of physical activity/need for weight loss, high cholesterol, hypertension and this TIA event. Pt educated on stroke signs and symptoms using BEFAST acronym with pt verbalizing understanding. Pt encouraged to continue getting up from desk frequently throughout his work day to improve daily activity levels, asking his boss for a standing desk and to join his wife when she walks at a local park each morning. Pt understanding and agreeable to increasing daily activity. Pt has no concerns over returning home and all pt questions addressed. Pt is currently at baseline level of functioning and is safe to return home without further acute  rehab.     Follow Up Recommendations No PT follow up    Equipment Recommendations  None recommended by PT    Recommendations for Other Services       Precautions / Restrictions Precautions Precautions: None Restrictions Weight Bearing Restrictions: No      Mobility  Bed Mobility Overal bed mobility: Independent             General bed mobility comments: ind getting EOB without physical assist or use of bed rails, easily able to get out of bed  Transfers Overall transfer level: Independent Equipment used: None             General transfer comment: safe with STS from recliner and low bed  Ambulation/Gait Ambulation/Gait assistance: Supervision Gait Distance (Feet): 360 Feet Assistive device: None Gait Pattern/deviations: WFL(Within Functional Limits) Gait velocity: normal   General Gait Details: pt ambulated 2 laps around nurses station steady without AD, pt steady with changes in gait speed, vertical and horizontal head turns and stepping over obstacle, pt reports at baseline  Stairs Stairs: Yes Stairs assistance: Modified independent (Device/Increase time) Stair Management: One rail Left;Alternating pattern;Forwards Number of Stairs: 4 General stair comments: pt safely ascended/descended 4 steps, use of rail and without rail and safe  Wheelchair Mobility    Modified Rankin (Stroke Patients Only)       Balance Overall balance assessment: No apparent balance deficits (not formally assessed)  Pertinent Vitals/Pain Pain Assessment: No/denies pain    Home Living Family/patient expects to be discharged to:: Private residence Living Arrangements: Spouse/significant other Available Help at Discharge: Family;Available 24 hours/day Type of Home: House Home Access: Level entry     Home Layout: Multi-level;Bed/bath upstairs Home Equipment: None      Prior Function Level of Independence:  Independent         Comments: pt reports ind with all ADLs, IADLs, no recent falls, still working a desk job     Journalist, newspaper        Extremity/Trunk Assessment   Upper Extremity Assessment Upper Extremity Assessment: Overall WFL for tasks assessed    Lower Extremity Assessment Lower Extremity Assessment: Overall WFL for tasks assessed(strength 5/5)    Cervical / Trunk Assessment Cervical / Trunk Assessment: Normal  Communication      Cognition Arousal/Alertness: Awake/alert Behavior During Therapy: WFL for tasks assessed/performed Overall Cognitive Status: Within Functional Limits for tasks assessed                                        General Comments General comments (skin integrity, edema, etc.): VSS    Exercises Other Exercises Other Exercises: pt educated on increasing walking and other physical activity throughout the day to improve amount of exercising and aide in weight loss Other Exercises: pt educated and encouraged to ask boss at work for standing desk and to increase amount of time standing during the day while at work and getting up to walk and do exercises at least every hour while at work Other Exercises: pt encouraged to begin going to walk with his wife each morning at park near their house Other Exercises: pt educated on BEFAST for stroke awareness and symptom recognition   Assessment/Plan    PT Assessment Patent does not need any further PT services  PT Problem List         PT Treatment Interventions      PT Goals (Current goals can be found in the Care Plan section)  Acute Rehab PT Goals Patient Stated Goal: cont to work on losing weight PT Goal Formulation: With patient Time For Goal Achievement: 04/02/19 Potential to Achieve Goals: Good    Frequency     Barriers to discharge        Co-evaluation               AM-PAC PT "6 Clicks" Mobility  Outcome Measure Help needed turning from your back to your  side while in a flat bed without using bedrails?: None Help needed moving from lying on your back to sitting on the side of a flat bed without using bedrails?: None Help needed moving to and from a bed to a chair (including a wheelchair)?: None Help needed standing up from a chair using your arms (e.g., wheelchair or bedside chair)?: None Help needed to walk in hospital room?: None Help needed climbing 3-5 steps with a railing? : None 6 Click Score: 24    End of Session Equipment Utilized During Treatment: Gait belt Activity Tolerance: Patient tolerated treatment well Patient left: in chair;with call bell/phone within reach Nurse Communication: Mobility status PT Visit Diagnosis: Other abnormalities of gait and mobility (R26.89)    Time: [id number] PT Time Calculation (min) (ACUTE ONLY): 34 min   Charges:   PT Evaluation $PT Eval Moderate Complexity: 1 Mod PT Treatments $Therapeutic Activity: 8-22 mins  Zachary George PT, DPT 10:53 AM,03/19/19 [phone]  Shaquina Gillham Drucilla Chalet 03/19/2019, 10:44 AM

## 2019-03-19 NOTE — Progress Notes (Signed)
DISCHARGE NOTE:  Pt given discharge instructions, Pt verbalized understanding. Pt wheeled to car by staff.

## 2019-03-19 NOTE — Discharge Instructions (Signed)
Holter monitor to be set up for 30 days per Neurology recommendation

## 2019-03-19 NOTE — Consult Note (Signed)
Requesting Physician: Dr.Sona Posey Pronto      Chief Complaint: Garbled speech, blurry vision   History obtained from: Patient and Chart    HPI:                                                                                                                                       Michael Aguilar is a 64 y.o. male possible history significant for hypertension, not on medications, cancer, sleep apnea on CPAP, obesity presents to emergency department after having sudden onset difficulty getting words.  Patient was his normal self, went to the museum with this grandkids when suddenly around 3 PM he felt he had bilateral visual blurriness, sensation of fogginess had trouble getting words.  His speech was slurred and he just could not get the right words.  This lasted about 15 to 20 minutes, however per EP notes still have mild aphasia.    Tele-neurology was consulted -score has only for mild facial droop. Work-up in [place] ED included CT head which was unremarkable.  MRI brain was also obtained which showed no acute stroke.  Blood pressure was 99991111 systolic according to the patient, remain high in the ED. Patient admitted for TIA work-up.  Patient denies any headache weakness or numbness.  Date last known well: 03/18/19 Time last known well: 3 PM tPA Given: No, symptoms resolved NIHSS: 1  Baseline MRS 0    Past Medical History:  Diagnosis Date  . Cancer ([place])   . Sleep apnea 12/13/2006   Sleep Apnea.  On CPAP since 2005.    Past Surgical History:  Procedure Laterality Date  . left shoulder surgery Left 2019    Family History  Problem Relation Age of Onset  . Bladder Cancer Father    Social History:  reports that he has never smoked. He has never used smokeless tobacco. He reports current alcohol use. He reports that he does not use drugs.  Allergies:  Allergies  Allergen Reactions  . Penicillins Rash    Other reaction(s): Other (See Comments)    Medications:                                                                                                                         I reviewed home medications   ROS:  14 systems reviewed and negative except above    Examination:                                                                                                      General: Appears well-developed  Psych: Affect appropriate to situation Eyes: No scleral injection HENT: No OP obstrucion Head: Normocephalic.  Cardiovascular: Normal rate and regular rhythm. Respiratory: Effort normal and breath sounds normal to anterior ascultation GI: Soft.  No distension. There is no tenderness.  Skin: WDI    Neurological Examination Mental Status: Alert, oriented, thought content appropriate.  Speech fluent without evidence of aphasia. Able to follow 3 step commands without difficulty. Cranial Nerves: II: Visual fields grossly normal,  III,IV, VI: ptosis not present, extra-ocular motions intact bilaterally, pupils equal, round, reactive to light and accommodation V,VII: smile symmetric, facial light touch sensation normal bilaterally VIII: hearing normal bilaterally IX,X: uvula rises symmetrically XI: bilateral shoulder shrug XII: midline tongue extension Motor: Right : Upper extremity   5/5    Left:     Upper extremity   5/5  Lower extremity   5/5     Lower extremity   5/5 Tone and bulk:normal tone throughout; no atrophy noted Sensory: Pinprick and light touch intact throughout, bilaterally Deep Tendon Reflexes: 2+ and symmetric throughout Plantars: Right: downgoing   Left: downgoing Cerebellar: normal finger-to-nose, normal rapid alternating movements and normal heel-to-shin test Gait: normal gait and station     Lab Results: Basic Metabolic Panel: Recent Labs  Lab 03/18/19 1536  NA 136  K 4.0  CL 103  CO2 24   GLUCOSE 101*  BUN 23  CREATININE 1.18  CALCIUM 9.5    CBC: Recent Labs  Lab 03/18/19 1536  WBC 7.1  NEUTROABS 3.8  HGB 14.4  HCT 42.6  MCV 90.3  PLT 181    Coagulation Studies: Recent Labs    03/18/19 1536  LABPROT 12.4  INR 0.9    Imaging: CT Angio Head W or Wo Contrast  Result Date: 03/18/2019 CLINICAL DATA:  Slurred speech. EXAM: CT ANGIOGRAPHY HEAD AND NECK CT PERFUSION BRAIN TECHNIQUE: Multidetector CT imaging of the head and neck was performed using the standard protocol during bolus administration of intravenous contrast. Multiplanar CT image reconstructions and MIPs were obtained to evaluate the vascular anatomy. Carotid stenosis measurements (when applicable) are obtained utilizing NASCET criteria, using the distal internal carotid diameter as the denominator. Multiphase CT imaging of the brain was performed following IV bolus contrast injection. Subsequent parametric perfusion maps were calculated using RAPID software. CONTRAST:  185mL OMNIPAQUE IOHEXOL 350 MG/ML SOLN COMPARISON:  None. FINDINGS: CTA NECK FINDINGS Aortic arch: Standard 3 vessel aortic arch with widely patent arch vessel origins. Right carotid system: Patent and smooth without evidence of stenosis or dissection. Punctate focus of atherosclerotic calcification in the carotid bulb. Left carotid system: Patent and smooth without evidence of stenosis or dissection. Vertebral arteries: Patent, smooth, and codominant without evidence of stenosis or dissection with assessment of the left V1 segment mildly limited by venous contrast. Skeleton: Moderate cervical disc and facet  degeneration. Fusion across the C5-6 disc space. Other neck: No evidence of cervical lymphadenopathy or mass. Upper chest: Clear lung apices. Review of the MIP images confirms the above findings CTA HEAD FINDINGS Anterior circulation: The internal carotid arteries are patent from skull base to carotid termini with atherosclerotic plaque resulting  in mild proximal right supraclinoid stenosis. ACAs and MCAs are patent without evidence of proximal branch occlusion or flow limiting A1 or M1 stenosis. Diffuse irregular appearance of the small and medium-sized vessels on thick MIPs is attributed to technical factors but does limit assessment for branch vessel stenosis. No aneurysm is identified. Posterior circulation: The intracranial vertebral arteries are widely patent to the basilar. The basilar artery is widely patent. Posterior communicating arteries are diminutive or absent. PCAs are patent without evidence of flow limiting proximal stenosis. No aneurysm is identified. Venous sinuses: Patent. Anatomic variants: None. Review of the MIP images confirms the above findings CT Brain Perfusion Findings: ASPECTS: 10 CBF (<30%) Volume: 0 mL Perfusion (Tmax>6.0s) volume: 0 mL Mismatch Volume: n/a Infarction Location: No infarct evident by CTP IMPRESSION: 1. No emergent large vessel occlusion. 2. Mild intracranial atherosclerosis with mild right ICA stenosis. 3. Widely patent cervical carotid and vertebral arteries. 4. Negative CTP. These results were called by telephone at the time of interpretation on 03/18/2019 at 3:58 pm to Dr. Shirlyn Goltz , who verbally acknowledged these results. Electronically Signed   By: Logan Bores M.D.   On: 03/18/2019 16:24   CT Angio Neck W and/or Wo Contrast  Result Date: 03/18/2019 CLINICAL DATA:  Slurred speech. EXAM: CT ANGIOGRAPHY HEAD AND NECK CT PERFUSION BRAIN TECHNIQUE: Multidetector CT imaging of the head and neck was performed using the standard protocol during bolus administration of intravenous contrast. Multiplanar CT image reconstructions and MIPs were obtained to evaluate the vascular anatomy. Carotid stenosis measurements (when applicable) are obtained utilizing NASCET criteria, using the distal internal carotid diameter as the denominator. Multiphase CT imaging of the brain was performed following IV bolus contrast  injection. Subsequent parametric perfusion maps were calculated using RAPID software. CONTRAST:  112mL OMNIPAQUE IOHEXOL 350 MG/ML SOLN COMPARISON:  None. FINDINGS: CTA NECK FINDINGS Aortic arch: Standard 3 vessel aortic arch with widely patent arch vessel origins. Right carotid system: Patent and smooth without evidence of stenosis or dissection. Punctate focus of atherosclerotic calcification in the carotid bulb. Left carotid system: Patent and smooth without evidence of stenosis or dissection. Vertebral arteries: Patent, smooth, and codominant without evidence of stenosis or dissection with assessment of the left V1 segment mildly limited by venous contrast. Skeleton: Moderate cervical disc and facet degeneration. Fusion across the C5-6 disc space. Other neck: No evidence of cervical lymphadenopathy or mass. Upper chest: Clear lung apices. Review of the MIP images confirms the above findings CTA HEAD FINDINGS Anterior circulation: The internal carotid arteries are patent from skull base to carotid termini with atherosclerotic plaque resulting in mild proximal right supraclinoid stenosis. ACAs and MCAs are patent without evidence of proximal branch occlusion or flow limiting A1 or M1 stenosis. Diffuse irregular appearance of the small and medium-sized vessels on thick MIPs is attributed to technical factors but does limit assessment for branch vessel stenosis. No aneurysm is identified. Posterior circulation: The intracranial vertebral arteries are widely patent to the basilar. The basilar artery is widely patent. Posterior communicating arteries are diminutive or absent. PCAs are patent without evidence of flow limiting proximal stenosis. No aneurysm is identified. Venous sinuses: Patent. Anatomic variants: None. Review of the MIP images  confirms the above findings CT Brain Perfusion Findings: ASPECTS: 10 CBF (<30%) Volume: 0 mL Perfusion (Tmax>6.0s) volume: 0 mL Mismatch Volume: n/a Infarction Location: No  infarct evident by CTP IMPRESSION: 1. No emergent large vessel occlusion. 2. Mild intracranial atherosclerosis with mild right ICA stenosis. 3. Widely patent cervical carotid and vertebral arteries. 4. Negative CTP. These results were called by telephone at the time of interpretation on 03/18/2019 at 3:58 pm to Dr. Shirlyn Goltz , who verbally acknowledged these results. Electronically Signed   By: Logan Bores M.D.   On: 03/18/2019 16:24   CT ANGIO CHEST PE W OR WO CONTRAST  Result Date: 03/18/2019 CLINICAL DATA:  Shortness of breath, positive D-dimer EXAM: CT ANGIOGRAPHY CHEST WITH CONTRAST TECHNIQUE: Multidetector CT imaging of the chest was performed using the standard protocol during bolus administration of intravenous contrast. Multiplanar CT image reconstructions and MIPs were obtained to evaluate the vascular anatomy. CONTRAST:  51mL OMNIPAQUE IOHEXOL 350 MG/ML SOLN COMPARISON:  None. FINDINGS: Cardiovascular: This is a technically adequate evaluation of the pulmonary vasculature. No filling defects or pulmonary emboli. No pericardial effusion.  The thoracic aorta is normal in caliber. Mediastinum/Nodes: No enlarged mediastinal, hilar, or axillary lymph nodes. Thyroid gland, trachea, and esophagus demonstrate no significant findings. Lungs/Pleura: No airspace disease, effusion, or pneumothorax. Central airways are patent. Upper Abdomen: No acute abnormality. Musculoskeletal: No acute or destructive bony lesions. Reconstructed images demonstrate no additional findings. Review of the MIP images confirms the above findings. IMPRESSION: 1. No evidence of pulmonary embolus. 2. No acute intrathoracic process. Electronically Signed   By: Randa Ngo M.D.   On: 03/18/2019 23:36   MR BRAIN WO CONTRAST  Result Date: 03/18/2019 CLINICAL DATA:  TIA. Slurred speech and facial droop. EXAM: MRI HEAD WITHOUT CONTRAST TECHNIQUE: Multiplanar, multiecho pulse sequences of the brain and surrounding structures were  obtained without intravenous contrast. COMPARISON:  Head CT, CTA, and CTP 03/18/2019 FINDINGS: Brain: There is no evidence of acute infarct, intracranial hemorrhage, mass, midline shift, or extra-axial fluid collection. The ventricles and sulci are normal. A few small foci of T2 hyperintensity in the cerebral white matter are within normal limits for age. Vascular: Major intracranial vascular flow voids are preserved. Skull and upper cervical spine: Unremarkable bone marrow signal. Sinuses/Orbits: Unremarkable orbits. Mucosal thickening throughout the paranasal sinuses, moderate in the ethmoid sinuses. Right maxillary sinus mucous retention cyst. Clear mastoid air cells. Other: None. IMPRESSION: Unremarkable appearance of the brain for age. Electronically Signed   By: Logan Bores M.D.   On: 03/18/2019 17:37   CT CEREBRAL PERFUSION W CONTRAST  Result Date: 03/18/2019 CLINICAL DATA:  Slurred speech. EXAM: CT ANGIOGRAPHY HEAD AND NECK CT PERFUSION BRAIN TECHNIQUE: Multidetector CT imaging of the head and neck was performed using the standard protocol during bolus administration of intravenous contrast. Multiplanar CT image reconstructions and MIPs were obtained to evaluate the vascular anatomy. Carotid stenosis measurements (when applicable) are obtained utilizing NASCET criteria, using the distal internal carotid diameter as the denominator. Multiphase CT imaging of the brain was performed following IV bolus contrast injection. Subsequent parametric perfusion maps were calculated using RAPID software. CONTRAST:  146mL OMNIPAQUE IOHEXOL 350 MG/ML SOLN COMPARISON:  None. FINDINGS: CTA NECK FINDINGS Aortic arch: Standard 3 vessel aortic arch with widely patent arch vessel origins. Right carotid system: Patent and smooth without evidence of stenosis or dissection. Punctate focus of atherosclerotic calcification in the carotid bulb. Left carotid system: Patent and smooth without evidence of stenosis or dissection.  Vertebral arteries:  Patent, smooth, and codominant without evidence of stenosis or dissection with assessment of the left V1 segment mildly limited by venous contrast. Skeleton: Moderate cervical disc and facet degeneration. Fusion across the C5-6 disc space. Other neck: No evidence of cervical lymphadenopathy or mass. Upper chest: Clear lung apices. Review of the MIP images confirms the above findings CTA HEAD FINDINGS Anterior circulation: The internal carotid arteries are patent from skull base to carotid termini with atherosclerotic plaque resulting in mild proximal right supraclinoid stenosis. ACAs and MCAs are patent without evidence of proximal branch occlusion or flow limiting A1 or M1 stenosis. Diffuse irregular appearance of the small and medium-sized vessels on thick MIPs is attributed to technical factors but does limit assessment for branch vessel stenosis. No aneurysm is identified. Posterior circulation: The intracranial vertebral arteries are widely patent to the basilar. The basilar artery is widely patent. Posterior communicating arteries are diminutive or absent. PCAs are patent without evidence of flow limiting proximal stenosis. No aneurysm is identified. Venous sinuses: Patent. Anatomic variants: None. Review of the MIP images confirms the above findings CT Brain Perfusion Findings: ASPECTS: 10 CBF (<30%) Volume: 0 mL Perfusion (Tmax>6.0s) volume: 0 mL Mismatch Volume: n/a Infarction Location: No infarct evident by CTP IMPRESSION: 1. No emergent large vessel occlusion. 2. Mild intracranial atherosclerosis with mild right ICA stenosis. 3. Widely patent cervical carotid and vertebral arteries. 4. Negative CTP. These results were called by telephone at the time of interpretation on 03/18/2019 at 3:58 pm to Dr. Shirlyn Goltz , who verbally acknowledged these results. Electronically Signed   By: Logan Bores M.D.   On: 03/18/2019 16:24   CT HEAD CODE STROKE WO CONTRAST  Result Date:  03/18/2019 CLINICAL DATA:  Code stroke.  Slurred speech. EXAM: CT HEAD WITHOUT CONTRAST TECHNIQUE: Contiguous axial images were obtained from the base of the skull through the vertex without intravenous contrast. COMPARISON:  None. FINDINGS: Brain: There is no evidence of acute infarct, intracranial hemorrhage, mass, midline shift, or extra-axial fluid collection. The ventricles and sulci are within normal limits for age. Vascular: Calcified atherosclerosis at the skull base. No hyperdense vessel. Skull: No fracture or suspicious osseous lesion. Sinuses/Orbits: Moderate bilateral ethmoid air cell and mild bilateral sphenoid sinus mucosal thickening. Partially visualized right maxillary sinus osteitis indicative of chronic inflammation. Clear mastoid air cells. Unremarkable orbits. Other: None. ASPECTS [facility] Stroke Program Early CT Score) - Ganglionic level infarction (caudate, lentiform nuclei, internal capsule, insula, M1-M3 cortex): 7 - Supraganglionic infarction (M4-M6 cortex): 3 Total score (0-10 with 10 being normal): 10 IMPRESSION: 1. No evidence of acute intracranial abnormality. 2. ASPECTS is 10. These results were called by telephone at the time of interpretation on 03/18/2019 at 3:58 pm to Dr. Shirlyn Goltz , who verbally acknowledged these results. Electronically Signed   By: Logan Bores M.D.   On: 03/18/2019 15:59     ASSESSMENT AND PLAN  64 y.o. male possible history significant for hypertension, not on medications, cancer, sleep apnea on CPAP, obesity presents to emergency department after having sudden onset difficulty getting words.   Transient ischemic attack versus hypertensive emergency  Recommend # MRI of the brain without contrast: No acute abnormality #CTA Head and Neck: Mild intracranial atherosclerosis with mild right ICA stenosis #CT perfusion: negative for ischemia #Transthoracic Echo : Pending # Start patient on ASA 325mg  daily #Zetia started for hyperlipidemia # BP goal:  Normotension, BP meds per medicine service # HBAIC and Lipid profile # Telemetry monitoring for A. Fib, 30-day Holter monitor  on discharge  Outpatient neurology follow-up in 4 weeks.   Ares Cardozo [facility] Pager Number [phone]

## 2020-08-19 IMAGING — MR MR HEAD W/O CM
10 of 11 series · 38 of 48 positions shown · non-contrast
Comparison: Head CT, CTA, and CTP 03/18/2019

CLINICAL DATA: TIA. Slurred speech and facial droop.

EXAM:
MRI HEAD WITHOUT CONTRAST
TECHNIQUE: Multiplanar, multiecho pulse sequences of the brain and surrounding
structures were obtained without intravenous contrast.

[Series 5: ax dwi_tracew · axial · 3.0mm · 0.60mm/px · z∈[-75,+80]mm · 5 of 48 slices shown]
[im 1/48]
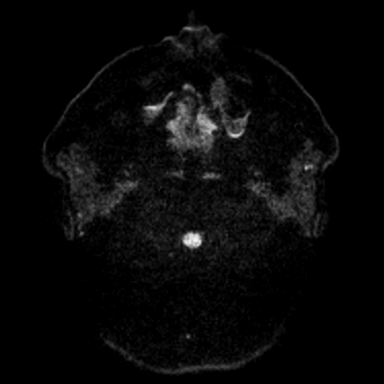
[im 12/48]
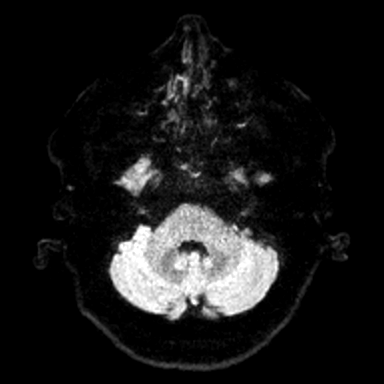
[im 24/48]
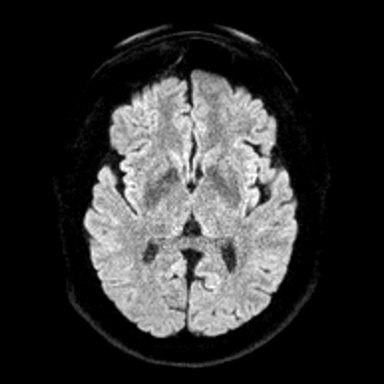
[im 36/48]
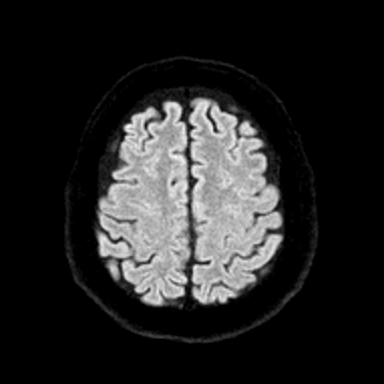
[im 48/48]
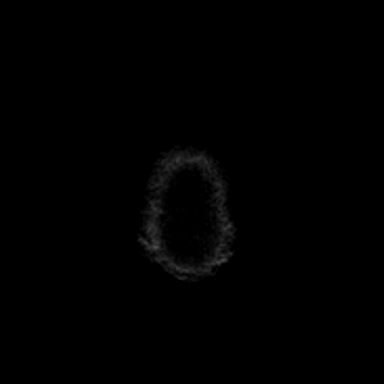

[Series 6: ax dwi_adc · axial · 3.0mm · 0.60mm/px · z∈[-75,+80]mm · 4 of 48 slices shown]
[im 1/48]
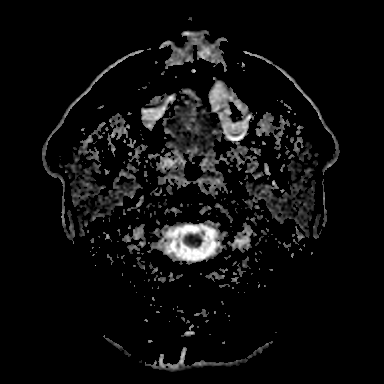
[im 16/48]
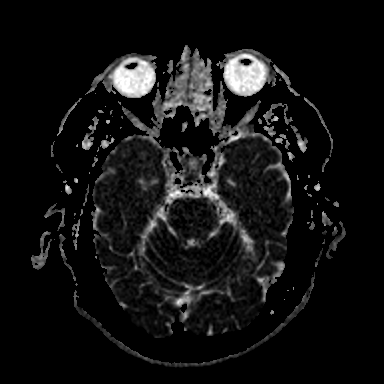
[im 32/48]
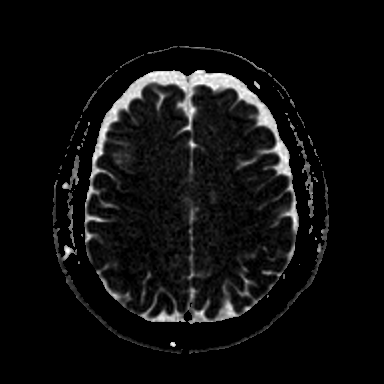
[im 48/48]
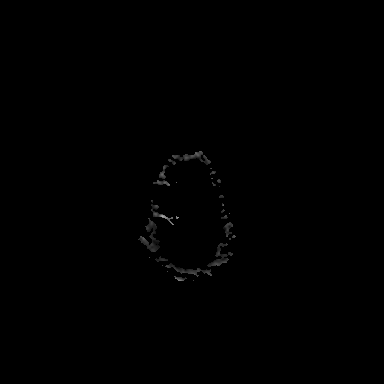

[Series 7: cor dwi_tracew · coronal · 5.0mm · 0.60mm/px · 3 of 38 slices shown]
[im 1/38]
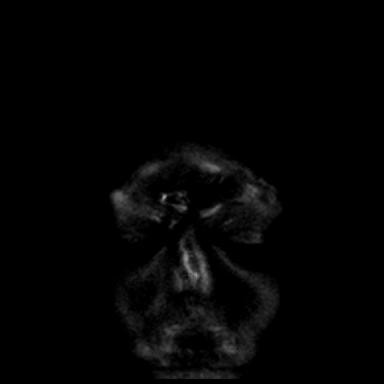
[im 19/38]
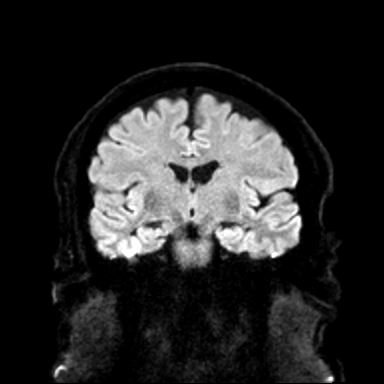
[im 38/38]
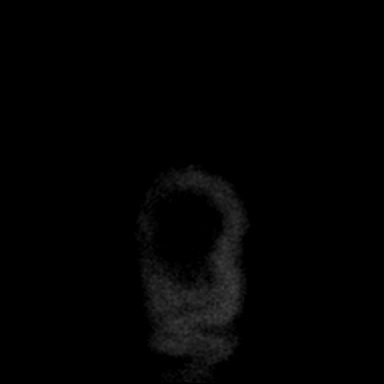

[Series 8: cor dwi_adc · coronal · 5.0mm · 0.60mm/px · 3 of 38 slices shown]
[im 1/38]
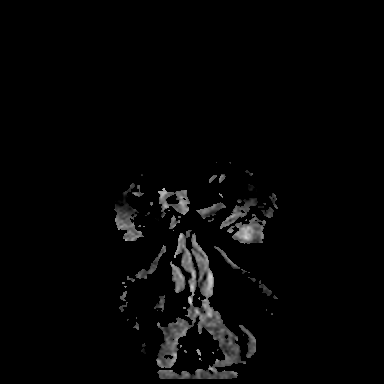
[im 19/38]
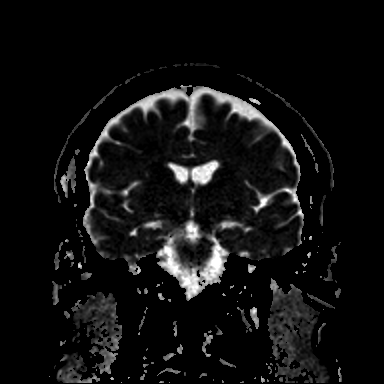
[im 38/38]
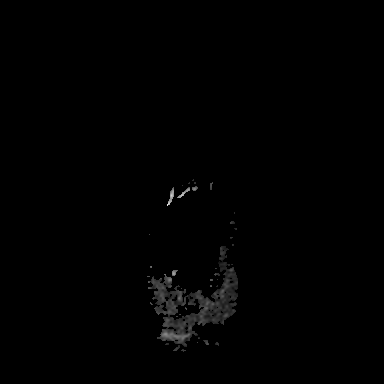

[Series 9: T1 · sagittal · 5.0mm · 0.62mm/px · 2 of 25 slices shown (1 of 2)]
[im 1/25]
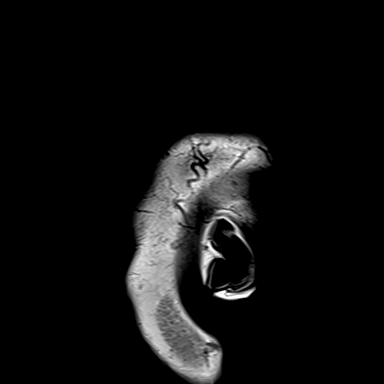
[im 25/25]
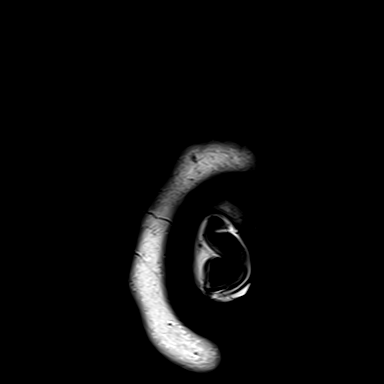

[Series 10: T2 · axial · 5.0mm · 0.55mm/px · z∈[-79,+83]mm · 2 of 28 slices shown (1 of 2)]
[im 1/28]
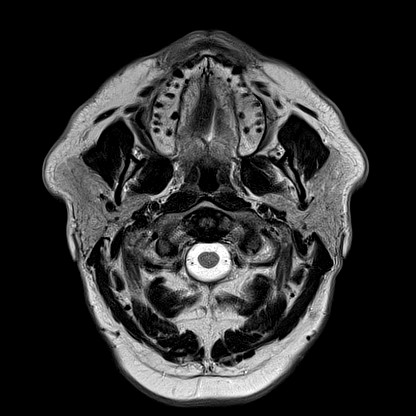
[im 28/28]
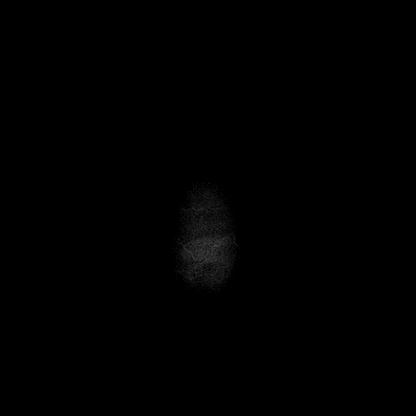

[Series 12: pha_images · axial · 3.0mm · 0.90mm/px · z∈[-80,+84]mm · 4 of 56 slices shown]
[im 1/56]
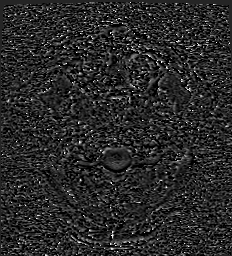
[im 19/56]
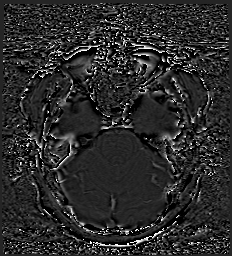
[im 37/56]
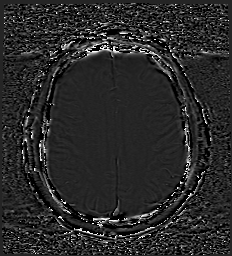
[im 56/56]
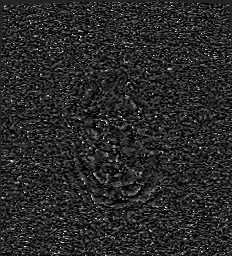

[Series 15: FLAIR · axial · 3.0mm · 0.55mm/px · z∈[-79,+83]mm · 4 of 55 slices shown]
[im 1/55]
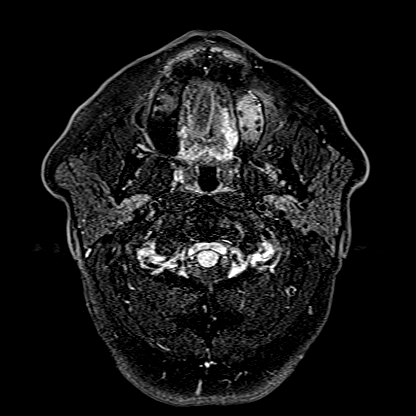
[im 19/55]
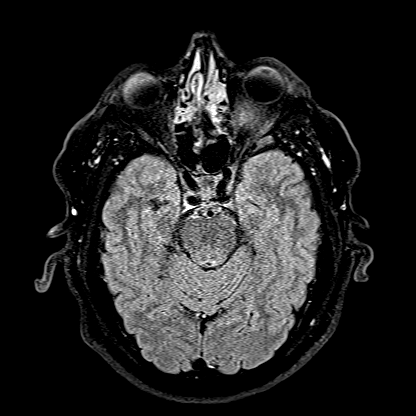
[im 37/55]
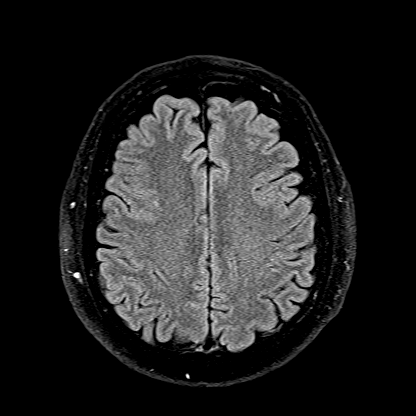
[im 55/55]
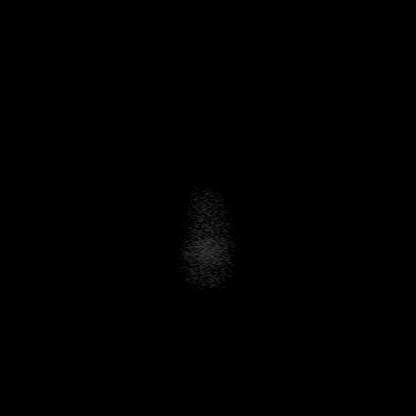

[Series 16: T1 · axial · 1.0mm · 0.98mm/px · z∈[-81,+94]mm · 8 of 176 slices shown (2 of 2)]
[im 1/176]
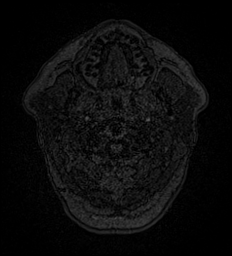
[im 27/176]
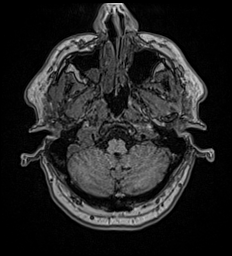
[im 54/176]
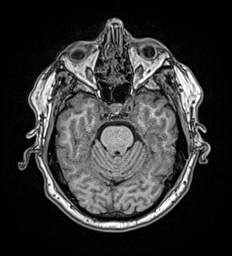
[im 81/176]
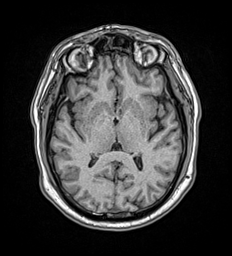
[im 95/176]
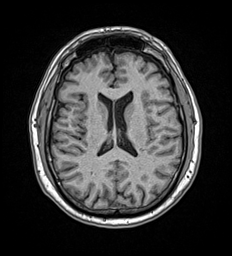
[im 122/176]
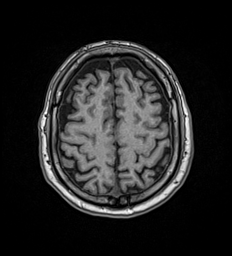
[im 149/176]
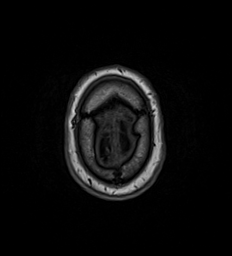
[im 176/176]
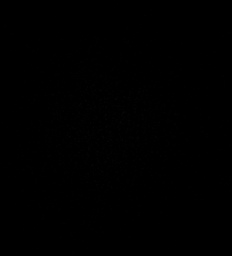

[Series 17: T2 · coronal · 5.0mm · 0.45mm/px · 3 of 33 slices shown (2 of 2)]
[im 1/33]
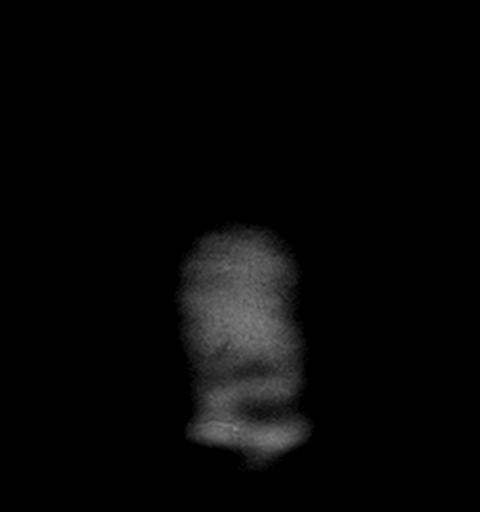
[im 17/33]
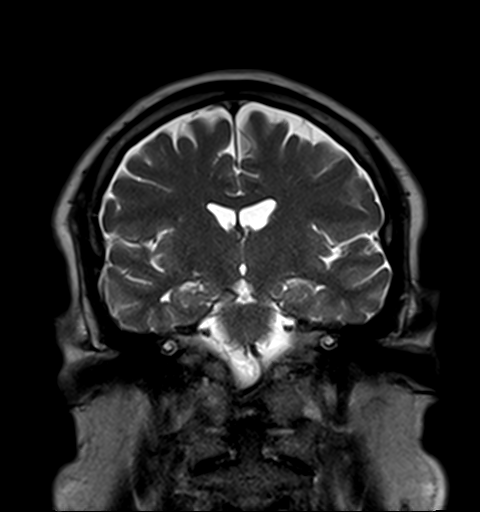
[im 33/33]
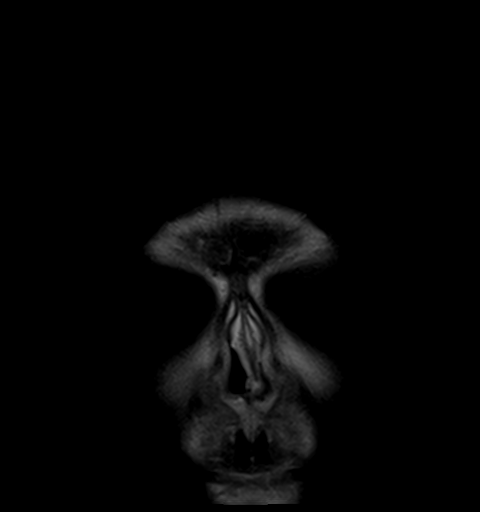

[38 of 48 positions shown; findings below may reference images not displayed]

FINDINGS: Brain: There is no evidence of acute infarct, intracranial
hemorrhage, mass, midline shift, or extra-axial fluid collection.
The ventricles and sulci are normal. A few small foci of T2
hyperintensity in the cerebral white matter are within normal limits
for age.

Vascular: Major intracranial vascular flow voids are preserved.

Skull and upper cervical spine: Unremarkable bone marrow signal.

Sinuses/Orbits: Unremarkable orbits. Mucosal thickening throughout
the paranasal sinuses, moderate in the ethmoid sinuses. Right
maxillary sinus mucous retention cyst. Clear mastoid air cells.

Other: None.
IMPRESSION: Unremarkable appearance of the brain for age.
